# Patient Record
Sex: Female | Born: 1975
Health system: Southern US, Community
[De-identification: ages and names within clinical notes are randomized; demographics above are authoritative.]

## PROBLEM LIST (undated history)

## (undated) DIAGNOSIS — E0590 Thyrotoxicosis, unspecified without thyrotoxic crisis or storm: Secondary | ICD-10-CM

## (undated) DIAGNOSIS — E559 Vitamin D deficiency, unspecified: Secondary | ICD-10-CM

## (undated) DIAGNOSIS — R87619 Unspecified abnormal cytological findings in specimens from cervix uteri: Secondary | ICD-10-CM

## (undated) DIAGNOSIS — E079 Disorder of thyroid, unspecified: Secondary | ICD-10-CM

## (undated) DIAGNOSIS — O24419 Gestational diabetes mellitus in pregnancy, unspecified control: Secondary | ICD-10-CM

## (undated) DIAGNOSIS — R519 Headache, unspecified: Secondary | ICD-10-CM

## (undated) DIAGNOSIS — R51 Headache: Secondary | ICD-10-CM

## (undated) DIAGNOSIS — Z5189 Encounter for other specified aftercare: Secondary | ICD-10-CM

## (undated) DIAGNOSIS — O139 Gestational [pregnancy-induced] hypertension without significant proteinuria, unspecified trimester: Secondary | ICD-10-CM

## (undated) DIAGNOSIS — D649 Anemia, unspecified: Secondary | ICD-10-CM

## (undated) HISTORY — DX: Gestational diabetes mellitus in pregnancy, unspecified control: O24.419

## (undated) HISTORY — DX: Anemia, unspecified: D64.9

## (undated) HISTORY — DX: Encounter for other specified aftercare: Z51.89

## (undated) HISTORY — DX: Unspecified abnormal cytological findings in specimens from cervix uteri: R87.619

## (undated) HISTORY — DX: Gestational (pregnancy-induced) hypertension without significant proteinuria, unspecified trimester: O13.9

## (undated) HISTORY — DX: Vitamin D deficiency, unspecified: E55.9

## (undated) HISTORY — DX: Disorder of thyroid, unspecified: E07.9

---

## 1993-01-12 HISTORY — PX: WISDOM TOOTH EXTRACTION: SHX21

## 1997-06-04 ENCOUNTER — Other Ambulatory Visit: Admission: RE | Admit: 1997-06-04 | Discharge: 1997-06-04 | Payer: Self-pay | Admitting: Obstetrics and Gynecology

## 1998-09-12 ENCOUNTER — Other Ambulatory Visit: Admission: RE | Admit: 1998-09-12 | Discharge: 1998-09-12 | Payer: Self-pay | Admitting: Obstetrics and Gynecology

## 1998-12-20 ENCOUNTER — Other Ambulatory Visit: Admission: RE | Admit: 1998-12-20 | Discharge: 1998-12-20 | Payer: Self-pay | Admitting: Obstetrics and Gynecology

## 1999-01-13 DIAGNOSIS — R87619 Unspecified abnormal cytological findings in specimens from cervix uteri: Secondary | ICD-10-CM

## 1999-01-13 HISTORY — DX: Unspecified abnormal cytological findings in specimens from cervix uteri: R87.619

## 1999-01-13 HISTORY — PX: COLPOSCOPY: SHX161

## 1999-02-19 ENCOUNTER — Other Ambulatory Visit: Admission: RE | Admit: 1999-02-19 | Discharge: 1999-02-19 | Payer: Self-pay | Admitting: Obstetrics and Gynecology

## 1999-02-19 ENCOUNTER — Encounter (INDEPENDENT_AMBULATORY_CARE_PROVIDER_SITE_OTHER): Payer: Self-pay

## 2000-01-12 ENCOUNTER — Encounter: Payer: Self-pay | Admitting: Obstetrics and Gynecology

## 2000-01-12 ENCOUNTER — Ambulatory Visit (HOSPITAL_COMMUNITY): Admission: RE | Admit: 2000-01-12 | Discharge: 2000-01-12 | Payer: Self-pay | Admitting: Obstetrics and Gynecology

## 2000-01-18 ENCOUNTER — Observation Stay (HOSPITAL_COMMUNITY): Admission: AD | Admit: 2000-01-18 | Discharge: 2000-01-19 | Payer: Self-pay | Admitting: Obstetrics and Gynecology

## 2000-01-30 ENCOUNTER — Inpatient Hospital Stay (HOSPITAL_COMMUNITY): Admission: AD | Admit: 2000-01-30 | Discharge: 2000-02-03 | Payer: Self-pay | Admitting: Obstetrics and Gynecology

## 2000-01-31 DIAGNOSIS — Z5189 Encounter for other specified aftercare: Secondary | ICD-10-CM

## 2000-01-31 HISTORY — DX: Encounter for other specified aftercare: Z51.89

## 2000-02-01 ENCOUNTER — Encounter: Payer: Self-pay | Admitting: Obstetrics and Gynecology

## 2000-02-20 ENCOUNTER — Encounter: Admission: RE | Admit: 2000-02-20 | Discharge: 2000-03-21 | Payer: Self-pay | Admitting: Obstetrics and Gynecology

## 2000-03-16 ENCOUNTER — Other Ambulatory Visit: Admission: RE | Admit: 2000-03-16 | Discharge: 2000-03-16 | Payer: Self-pay | Admitting: Obstetrics and Gynecology

## 2001-04-19 ENCOUNTER — Other Ambulatory Visit: Admission: RE | Admit: 2001-04-19 | Discharge: 2001-04-19 | Payer: Self-pay | Admitting: Obstetrics and Gynecology

## 2002-07-14 ENCOUNTER — Other Ambulatory Visit: Admission: RE | Admit: 2002-07-14 | Discharge: 2002-07-14 | Payer: Self-pay | Admitting: Obstetrics and Gynecology

## 2003-10-05 ENCOUNTER — Encounter: Admission: RE | Admit: 2003-10-05 | Discharge: 2003-10-05 | Payer: Self-pay | Admitting: Obstetrics and Gynecology

## 2004-11-17 ENCOUNTER — Inpatient Hospital Stay (HOSPITAL_COMMUNITY): Admission: AD | Admit: 2004-11-17 | Discharge: 2004-11-17 | Payer: Self-pay | Admitting: Obstetrics and Gynecology

## 2005-03-16 ENCOUNTER — Encounter: Admission: RE | Admit: 2005-03-16 | Discharge: 2005-03-16 | Payer: Self-pay | Admitting: Obstetrics and Gynecology

## 2005-04-13 ENCOUNTER — Ambulatory Visit (HOSPITAL_COMMUNITY): Admission: RE | Admit: 2005-04-13 | Discharge: 2005-04-13 | Payer: Self-pay | Admitting: Obstetrics and Gynecology

## 2005-04-14 ENCOUNTER — Inpatient Hospital Stay (HOSPITAL_COMMUNITY): Admission: AD | Admit: 2005-04-14 | Discharge: 2005-06-07 | Payer: Self-pay | Admitting: Obstetrics and Gynecology

## 2005-06-04 ENCOUNTER — Encounter (INDEPENDENT_AMBULATORY_CARE_PROVIDER_SITE_OTHER): Payer: Self-pay | Admitting: *Deleted

## 2007-06-18 IMAGING — US US FETAL BPP W/O NONSTRESS
1 series · 14 of 26 positions shown · non-contrast
Comparison: OB ultrasound 04/16/05.

CLINICAL DATA: 30 weeks pregnant.  Vaginal bleeding. 

 BIOPHYSICAL PROFILE

[Series 1: us fetal bpp w/o nonstress · 0.33mm/px · 26 acquisitions, 14 frames shown]
[im 1/26]
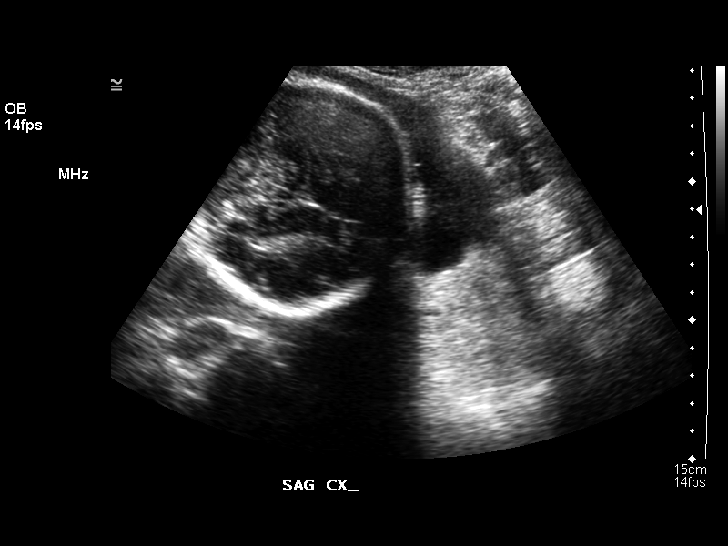
[im 3/26]
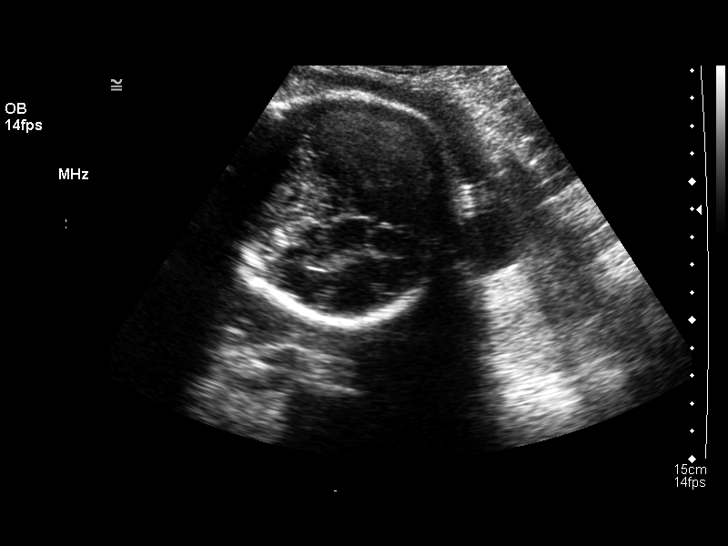
[im 5/26]
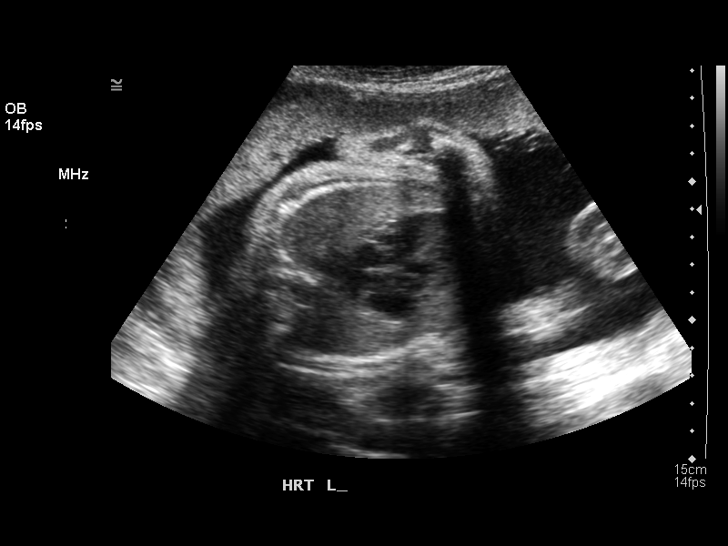
[im 7/26]
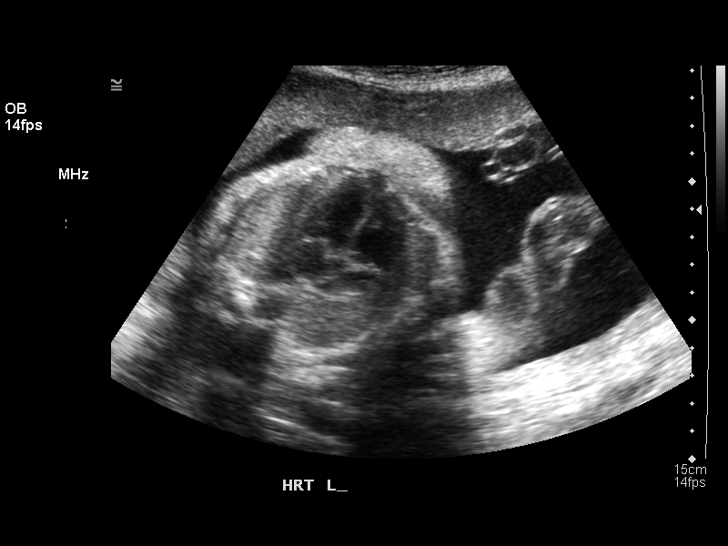
[im 9/26]
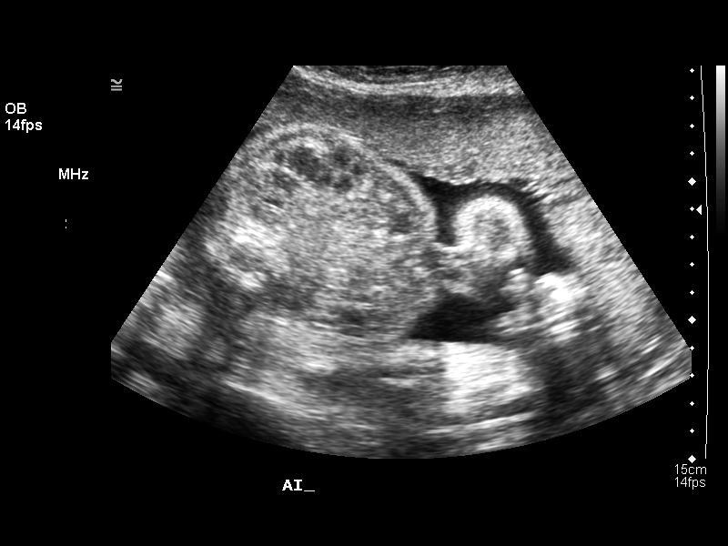
[im 11/26]
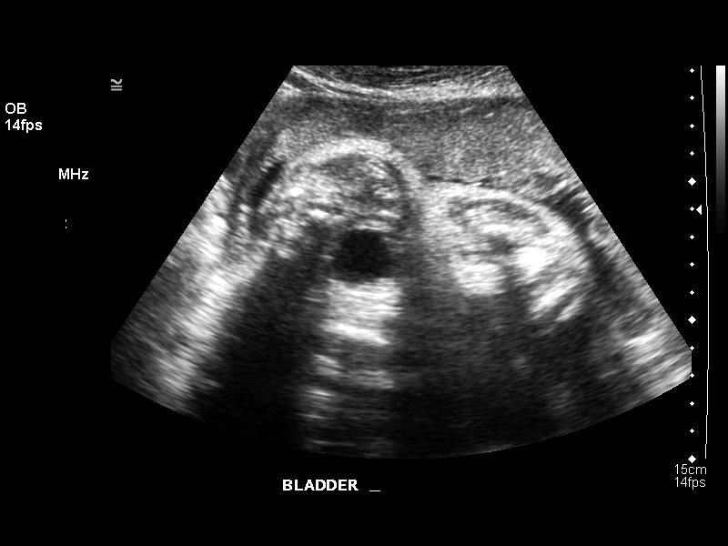
[im 13/26]
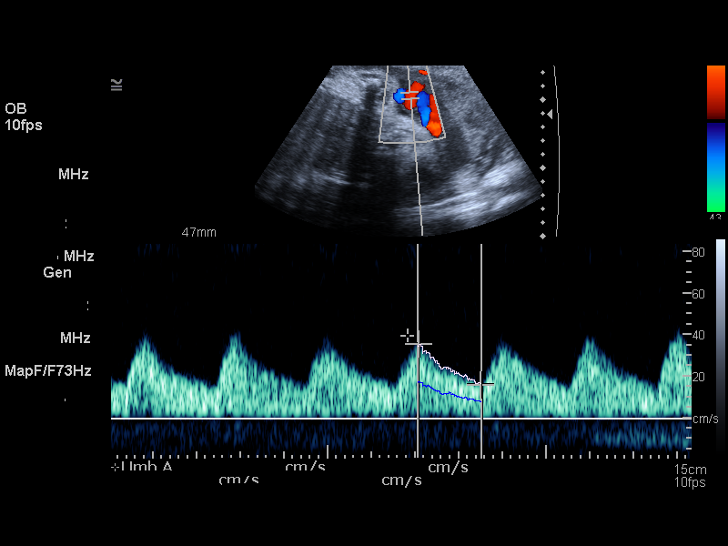
[im 14/26]
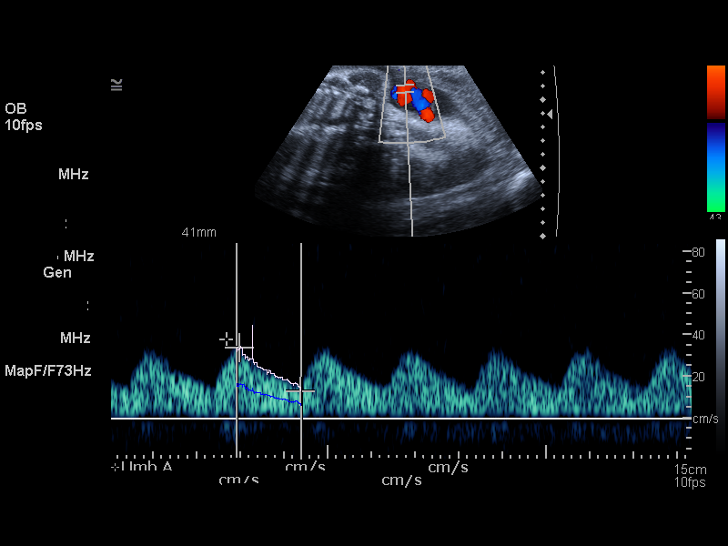
[im 16/26]
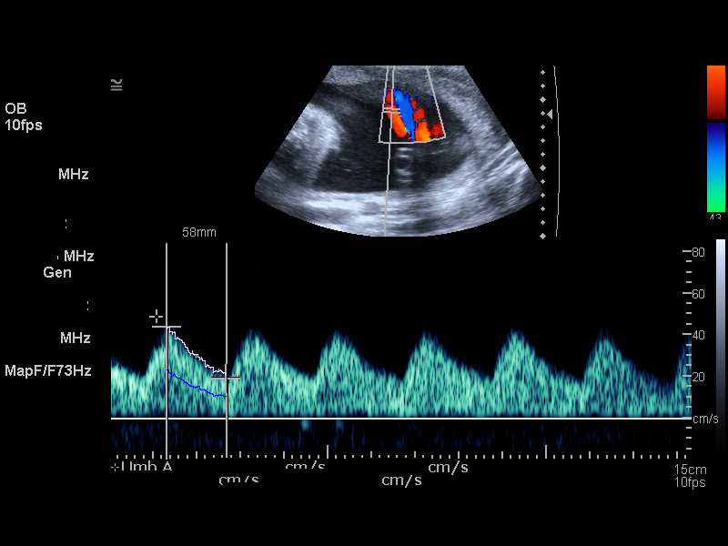
[im 18/26]
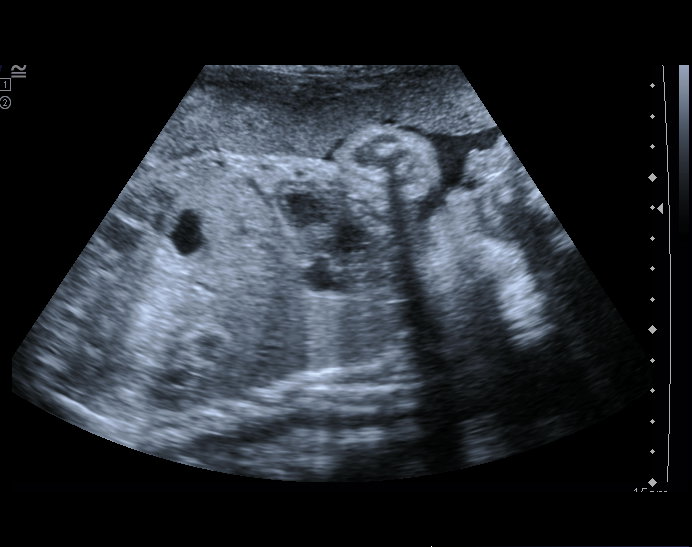
[im 20/26]
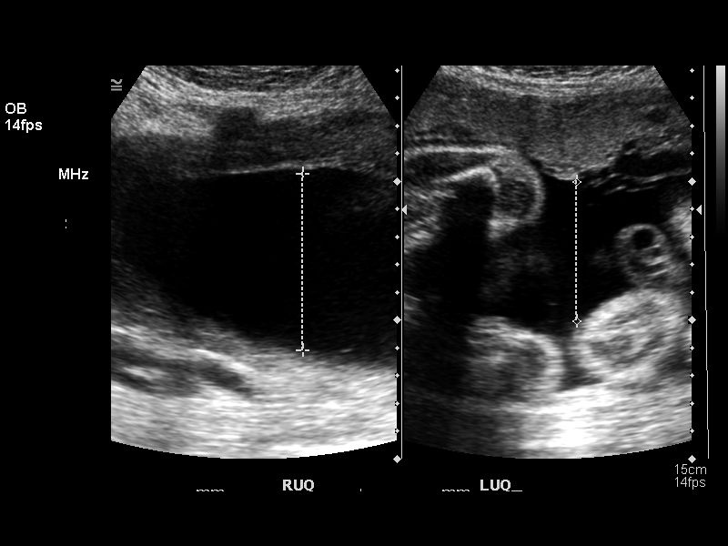
[im 22/26]
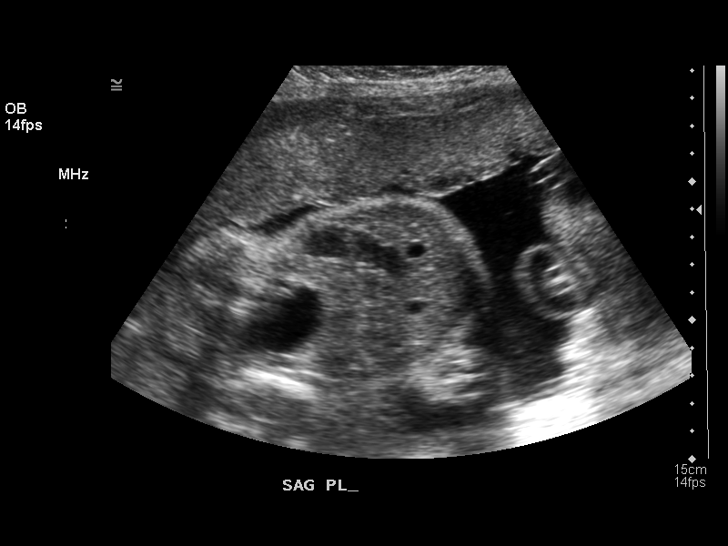
[im 24/26]
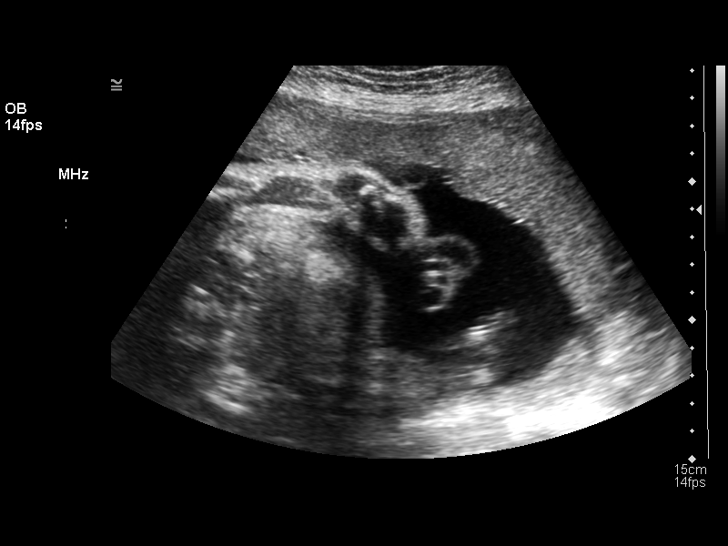
[im 26/26]
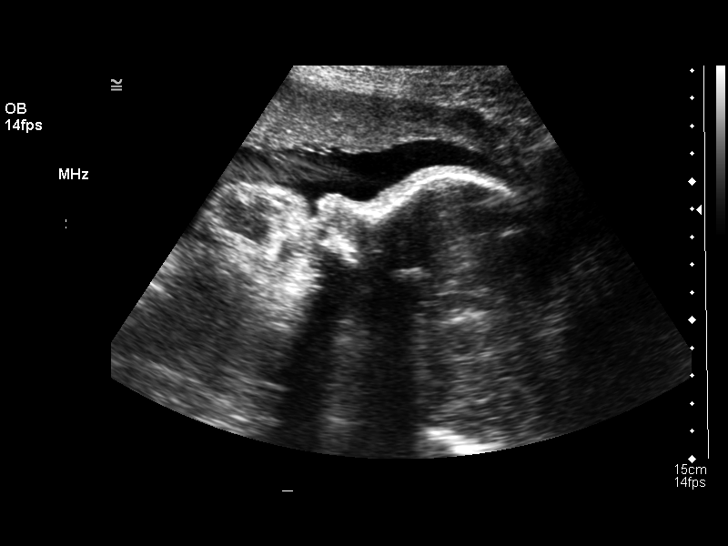

[14 of 26 positions shown; findings below may reference images not displayed]

Number of Fetuses:  1
 Heart rate:  160
 Movement:  Yes
 Breathing:  Yes
 Presentation:  Cephalic
 Placental Location:  Anterior
 Grade:  I
 Previa:  No
 Amniotic Fluid (Subjective):  Upper normal
 Amniotic Fluid (Objective): 21.0 cm AFI (5th -95th%ile = 8.8 ? 23.8 cm for 31 wks)

 Fetal measurements and complete anatomic evaluation were not requested.  The following fetal anatomy was visualized on this exam:  Lateral ventricles, four chamber heart, stomach, kidneys, and bladder.

 BPP SCORING
 Movements:  2  Time:  20 minutes
 Breathing:  2
 Tone: 2
 Amniotic Fluid:  2
 Total Score:  8

 MATERNAL UTERINE AND ADNEXAL FINDINGS
 Cervix:  Not evaluated.  

 DOPPLER ULTRASOUND OF FETUS:

 Umbilical Artery S/D Ratio:  2.44  (NL< 4.00)
IMPRESSION: 1.  Single live intrauterine gestation in cephalic presentation.  Biophysical profile [DATE] over 20 minutes.
 2.  Normal umbilical arterial Doppler S/D ratio and waveform morphology.

## 2007-10-11 ENCOUNTER — Ambulatory Visit (HOSPITAL_COMMUNITY): Admission: RE | Admit: 2007-10-11 | Discharge: 2007-10-12 | Payer: Self-pay | Admitting: Obstetrics and Gynecology

## 2007-10-11 ENCOUNTER — Encounter (INDEPENDENT_AMBULATORY_CARE_PROVIDER_SITE_OTHER): Payer: Self-pay | Admitting: Obstetrics and Gynecology

## 2007-10-11 HISTORY — PX: ABDOMINAL HYSTERECTOMY: SHX81

## 2008-05-12 LAB — HM PAP SMEAR

## 2008-07-26 ENCOUNTER — Ambulatory Visit: Payer: Self-pay | Admitting: Gastroenterology

## 2008-08-10 ENCOUNTER — Ambulatory Visit: Payer: Self-pay | Admitting: Gastroenterology

## 2008-09-07 ENCOUNTER — Ambulatory Visit: Payer: Self-pay | Admitting: Gastroenterology

## 2010-02-02 ENCOUNTER — Encounter: Payer: Self-pay | Admitting: Obstetrics and Gynecology

## 2010-05-27 NOTE — Op Note (Signed)
Alicia Alexander, Alicia Alexander          ACCOUNT NO.:  [id number]   MEDICAL RECORD NO.:  [id number]          PATIENT TYPE:  OIB   LOCATION:  9317                          FACILITY:  [facility]   PHYSICIAN:  Maxie Better, M.D.DATE OF BIRTH:  1975-07-24   DATE OF PROCEDURE:  10/11/2007  DATE OF DISCHARGE:                               OPERATIVE REPORT   PREOPERATIVE DIAGNOSES:  Menorrhagia, dysmenorrhea.   PROCEDURE:  Laparoscopic total hysterectomy.   POSTOPERATIVE DIAGNOSES:  Menorrhagia, dysmenorrhea.   ANESTHESIA:  General.   SURGEON:  Maxie Better, MD   ASSISTANT:  Lenoard Aden, MD   PROCEDURE:  Under adequate general anesthesia, the patient was placed in  the dorsal lithotomy position.  She was sterilely prepped and draped in  usual fashion.  An indwelling Foley catheter was sterilely placed.  Examination under anesthesia had revealed the uterus was slightly  enlarged.  No adnexal masses could be appreciated.  A weighted speculum  was placed in vagina.  Sims retractor used anteriorly.  The cervix was  grasped with a single-tooth tenaculum, it sounded to 8 cm.  Cervix was  then serially dilated and then a RUMI applicator was then inserted  without incident.  The weighted speculum was removed.  Attention was  then turned to the abdomen.  An infraumbilical incision was made after  0.25% percent Marcaine was injected, carried down to the rectus fascia.  The rectus fascia was opened transversely.  The parietal peritoneum was  opened bluntly.  The pursestring suture of 0 Vicryl was then placed and  a Hassan cannula was introduced into the abdomen without incident.  A  lighted video laparoscope was then inserted.  The abdomen was  insufflated.  Panoramic view was then done.  Normal liver edge noted.  Attention turned to the pelvis, 2 small incisions, 5 mm ports were  placed on the right and left side and 5 mm ports were placed under  direct visualization.  Inspection of the  pelvis was notable for normal  tubes and ovaries.  The left tube had some small paratubal cysts.  Some  bladder adhesions from her previous cesarean section, but otherwise  unremarkable pelvis and evidence of the bicornuate uterus which was  already known.  The procedure was started with the round ligaments being  on the left cauterized and cut.  The utero-ovarian ligament on the left  was then cauterized and cut until the level of the bladder reflection  was reached anteriorly.  The anterior peritoneum was opened.  There was  some scarring from the previous surgery, and therefore careful  dissection was then performed with the RUMI apparatus clearly being  noted to be bulging.  The uterine vessel was skeletonized on the left,  cauterized and cut.  The same procedure was performed on the  contralateral side.  Ureters were seen peristalsing deep in the pelvis.  Continued dissection of the bladder flap was then performed.  Once this  was done, the uterine vessels was also cauterized and cut on the right  as well.  After it was felt to be adequate, the anterior vagina was  opened above the  bladder flap and the circumferential incision was then  made with the posterior opening being done above the uterosacral  ligaments.  The uterus was ultimately detached from its vaginal  attachment, pulled into the vagina.  The cuff which had some bleeding  was then cauterized.  It was closed with a Quill suture.  Taking care  not to bring the bladder in, the integrity of the vaginal cuff was  examined explored with digital finger and was intact.  Small bleeder was  noted on the right side where the bladder reflected over and careful  cauterization was then performed with good hemostasis subsequently  noted.  When that was done, the was specimens removed from the vagina  and was copiously irrigated, suctioned and the bleeders cauterized.  Good hemostasis was noted.  At that point, the procedure was felt  to  have been complete.  A fourth incision site had been placed to the left  of the umbilicus in order to facilitate the closure of the vaginal cuff.  All instruments were then removed and the pursestring was closed  infraumbilically.  The skin incision was closed with 4-0 Vicryl  subcuticular stitch and/or Dermabond were appropriate.  Specimen of the  uterus and cervix sent to pathology.  Intraoperative fluid was 2 liters.  Urine output was 40 mL, clear yellow urine.  Estimated blood loss was  about 100 mL.  Sponge and instrument counts x2 was correct.  Complication was none.  The patient tolerated the procedure well and was  transferred to recovery in stable condition.      Maxie Better, M.D.  Electronically Signed     [place]/MEDQ  D:  10/11/2007  T:  10/12/2007  Job:  [id number]

## 2010-05-30 NOTE — H&P (Signed)
[facility]  Patient:    Alicia Alexander, Alicia Alexander                   MRN: [id number] Adm. Date:  01/30/00 Attending:  Nena Jordan A. Cherly Hensen, M.D.                         History and Physical  CHIEF COMPLAINT:              Oligohydramnios, elevated blood pressure, and breech presentation.  HISTORY OF PRESENT ILLNESS:   This is a 35 year old gravida 2 para 0-0-1-0 female at 74 and five-sevenths weeks gestation by [facility] of February 09, 2000 who is now being admitted for a primary cesarean section secondary to oligohydramnios and breech presentation.  The patient also pregnancy-induced hypertension.  She presented to the office for her preoperative visit prior to her scheduled cesarean section for February 02, 2000 at which time her fundal height was noted to be 34-35 cm and her blood pressure was 140/94.  PIH labs were obtained and ultrasound was ordered to check amniotic fluid index and fetal position.  The ultrasound revealed breech oblique lie, oligohydramnios with the amniotic fluid index in less than the third percentile, and the estimated fetal weight of 8 pounds 4 ounces.  The patient had an NST that was reactive on January 30, 2000.  The [facility] labs revealed a platelet count of 242,000.  Her hematocrit was 37.8, her uric acid was 5.7.  Her AST was 18, her ALT was 13.  The patient had had a 24-hour urine collection on January 22, 2000 that showed 180 mg/24 hours of protein and a creatinine clearance of 167 ml/minute.  The patient has been having antenatal surveillance with nonstress tests twice a week due to the pregnancy-induced hypertension.  The patient has not had any headaches, visual changes, or epigastric pain.  The patient was noted to have a beginning of elevation of blood pressures since January 08, 2000.  She was placed on bedrest with good response in the form of decreasing her peripheral edema.  Given her oligohydramnios and breech  presentation, decision was made to proceed with a primary cesarean section today.  Risks and benefits of the procedure have been explained to the patient and her husband.  LABORATORY DATA:              [facility] prenatal care, Maxie Better, M.D.  The blood type is O positive, antibody screen is negative.  RPR is nonreactive.  Rubella is immune.  Hepatitis B surface antigen is negative. HIV test was negative.  GC and chlamydia test was negative.  Pap was normal. Her AFP3 test was normal.  Anatomic fetal survey on September 30, 1999 revealed a normal survey at 21.4 weeks.  One-hour GCT was normal.  Group B strep culture was negative.  Several PIH studies have been done - the latest one is dictated in the history of present illness.  PAST MEDICAL HISTORY:  ALLERGIES:                    DARVOCET - nausea and vomiting.  MEDICATIONS:                  Prenatal vitamins.  MEDICAL HISTORY:              Irritable bowel syndrome.  SURGICAL HISTORY:             Wisdom tooth extraction.  Removal of a dysplastic compound nevus in 2001.  OBSTETRICAL HISTORY:          First trimester spontaneous abortion in September 1994, no complication.  GYNECOLOGICAL HISTORY:        History of abnormal Pap smear in August 2000. GCA treatment on the cervix has been performed due to the koilocytic atypia that had been noted on the cervical biopsy at the time of the colposcopy.  FAMILY HISTORY:               Maternal grandmother - hypertension.  Maternal grandmother - also hemophilia.  Maternal grandmother with some type of thyroid dysfunction.  SOCIAL HISTORY:               Married, nonsmoker, CMA.  REVIEW OF SYSTEMS:            Otherwise negative.  PHYSICAL EXAMINATION:  GENERAL:                      Well-developed, well-nourished gravid female in no acute distress.  VITAL SIGNS:                  Blood pressure 140/94; 150/100 at the hospital. Weight is 181 pounds.  SKIN:                          Shows no lesions.  There are some skin tags.  HEENT:                        Anicteric sclerae, pink conjunctivae. Oropharynx negative.  HEART:                        Regular rate and rhythm without murmur.  LUNGS:                        Clear to auscultation.  ABDOMEN:                      Gravid, fundal height of 34-35 cm.  PELVIC:                       Fingertip external os, internal os could not be reached, 2 cm long, presenting part out of pelvic.  EXTREMITIES:                   No edema.  Deep tendon reflexes 2+.  IMPRESSION:                   1. Oligohydramnios, breech presentation, term                                  gestation.                               2. Pregnancy-induced hypertension without the                                  need for magnesium sulfate.  PLAN:                         Admission, primary cesarean section.  Risks and benefits  of the procedure have been explained to the patient, including but not limited to infection, bleeding, injury to surrounding organ structures, possible need for cesarean section in the future, internal scar tissue.  All questions are answered. DD:  01/30/00 TD:  01/31/00 Job: [id number]

## 2010-05-30 NOTE — H&P (Signed)
NAMELONDYN, HOTARD          ACCOUNT NO.:  [id number]   MEDICAL RECORD NO.:  [id number]          PATIENT TYPE:  MAT   LOCATION:  MATC                          FACILITY:  [facility]   PHYSICIAN:  Richardean Sale, M.D.   DATE OF BIRTH:  Nov 30, 1975   DATE OF ADMISSION:  04/14/2005  DATE OF DISCHARGE:                                HISTORY & PHYSICAL   ADMITTING DIAGNOSIS:  A 30 plus week intrauterine pregnancy with vaginal  bleeding.   HISTORY OF PRESENT ILLNESS:  This is a 35 year old, gravida 3, para 1-0-1-1,  white female who presented to the office this morning complaining of  irregular contractions and vaginal bleeding at approximately 7:30 a.m.  The  patient states the bleeding has been brown to light red in color and  approximately a tablespoon total amount.  The patient was evaluated in the  office.  Cervix was closed and long with no evidence of rupture of membranes  and a small amount of vaginal bleeding noted.  The patient reports good  fetal movement.  The patient was subsequently sent to maternity admissions  for additional evaluation.   Prenatal care has been at [facility] with Dr. Maxie Better as the  primary attending.   Pregnancy is complicated by:  1.  Chronic hypertension controlled with labetalol 100 mg p.o. b.i.d.  2.  Gestational diabetes which is diet-controlled.  3.  Polyhydramnios.  4.  Bicornuate uterus.  5.  Hyperemesis in the first trimester with transient hyperthyroidism, not      requiring medication.  6.  Fetal renal pyelectastis which had resolved on ultrasound at 23 weeks.   PAST OBSTETRIC HISTORY:  1.  Gravida 1:  A 38-week delivery by cesarean section, complicated by      oligohydramnios, pre-eclampsia, and post partum hemorrhage requiring      transfusion.  2.  Gravida 2:  SAB.  3.  Gravida 3:  Current.   GYNECOLOGIC HISTORY:  1.  Bicornuate uterus.  2.  History of HPV.  No cervical conization, LEEP, or cryotherapy.  No history  of gonorrhea,  Chlamydia or herpes.   PAST SURGICAL HISTORY:  C-section x1.   PAST MEDICAL HISTORY:  1.  Chronic hypertension.  2.  Irritable bowel syndrome.   SOCIAL HISTORY:  She denies tobacco, alcohol, or drugs.   FAMILY HISTORY:  Positive for heart disease, hypertension, thyroid  dysfunction, rheumatoid arthritis in the maternal grandmother.  Tuberculosis  in the great grandmother.  Diabetes in her father.  Maternal aunt has lupus  anticoagulant/questionable antiphospholipid antibody syndrome.  Down  syndrome in a distant relative of the father of the baby and the patient's  mother had a first cousin born with a three chamber heart.   CANCER HISTORY:  Positive for melanoma in a paternal great grandmother and  pancreatic cancer in a paternal grandfather and maternal great grandmother.  No history of breast, ovarian, uterine, or colon cancer.   MEDICATIONS:  1.  Labetalol 100 mg p.o. b.i.d.  2.  Prenatal vitamins one p.o. every day.  3.  Previously on a Zofran pump in first trimester.   ALLERGIES:  No  known drug allergies.   REVIEW OF SYSTEMS:  Positive fetal movement.  Positive irregular  contractions.  Positive scant vaginal bleeding.  No loss of fluid.  The  patient denies chest pain, shortness of breath, headache, visual changes,  epigastric pain, nausea or vomiting.  Denies fevers, chills, or sweats.   PHYSICAL EXAMINATION:  VITAL SIGNS:  Blood pressure is 113/69, pulse 97,  respirations 20, temperature 97.6.  GENERAL:  She is a well-developed, well-nourished, white female who is in no  acute distress.  HEART:  Regular rate and rhythm.  LUNGS:  Clear to auscultation bilaterally.  ABDOMEN:  Gravid, soft, nontender.  Contractions palpate mild.  Appears AGA.  EXTREMITIES:  No cyanosis, clubbing, or edema.  Deep tendon reflexes are 1+  bilaterally with no clonus.  NEUROLOGIC:  Nonfocal.  PELVIC:  The cervix is closed, thick, and long with scant dark red blood  present  on exam glove.  No blood at introitus.   Fetal heart rate tracing is in the 130s/140s and reassuring.  Occasional  mild variable deceleration noted.  Contractions:  Irregular up to every four  to six minutes.   PRENATAL LABORATORY:  Blood type is O positive.  Antibody screen negative.  RPR nonreactive.  Rubella immune.  Hepatitis B surface antigen nonreactive.  GC Chlamydia screen negative.  First trimester urine culture negative.  Baseline pre-eclampsia labs within normal limits.  One-hour glucose  challenge 145.  Three-hour glucose challenge, fasting blood sugar 103, one-  hour 201, two-hour 150, three-hour 92.   HOSPITAL LABORATORY STUDIES:  Fibrinogen 487.  Platelets 262.  INR 0.9, PTT  26, PT 12.5.  These are all within normal limits.  CBC:  White count 9.7,  hemoglobin 12.4, hematocrit 36.2, platelets 262.   ASSESSMENT:  70.  A 35 year old gravida 3, para 1-0-1-1 white female at 30+ weeks with      third trimester bleeding, suspect preterm labor versus possible marginal      placental separation.  No previa seen on ultrasound.  Plan:  Admit to      antenatal.  Continue with fetal monitoring. We will administer      betamethasone 12.5 mg IM x1 now and repeat in 24 hours.  Start Procardia      10 mg p.o. q.6h.  We will obtain Group B beta strep culture and start      Ampicillin per protocol until results available.  2.  Hypertension.  The patient's blood pressure is normal today.  She did      not take her labetalol this morning.  We will monitor blood pressures      and see if there is any further hypertension with Procardia, and we will      administer labetalol as needed.  3.  History of prior cesarean delivery.  Fetal heart rate tracing is      reassuring.  No evidence of uterine rupture.  Recommend repeat cesarean      section for delivery.  4.  We will obtain a neonatal intensive care unit consultation on admission. 5.  Ultrasound performed within the last 2 weeks revealed  slightly increased      amniotic fluid and an appropriate for gestational age fetus.      Richardean Sale, M.D.  Electronically Signed     [place]/MEDQ  D:  04/14/2005  T:  04/14/2005  Job:  [id number]

## 2010-05-30 NOTE — Op Note (Signed)
Alicia Alexander, Alicia Alexander          ACCOUNT NO.:  [id number]   MEDICAL RECORD NO.:  [id number]          PATIENT TYPE:  INP   LOCATION:  9129                          FACILITY:  [facility]   PHYSICIAN:  Maxie Better, M.D.DATE OF BIRTH:  [birth year]   DATE OF PROCEDURE:  06/04/2005  DATE OF DISCHARGE:                                 OPERATIVE REPORT   PREOPERATIVE DIAGNOSIS:  Previous cesarean section, class A2 gestational  diabetes, chronic hypertension, bicornuate uterus, marginal placental  abruption.   PROCEDURE:  A repeat cesarean section Kerr hysterotomy.   POSTOPERATIVE DIAGNOSIS:  Previous cesarean section, class A2 gestational  diabetes, chronic hypertension, bicornuate uterus, marginal placental  abruption.   ANESTHESIA:  Spinal.   SURGEON:  Maxie Better, M.D.   ASSISTANT:  Richardean Sale, M.D.   PROCEDURE:  Under adequate spinal anesthesia, the patient is placed in the  supine position with left lateral tilt.  She was sterilely prepped and  draped in usual fashion.  Quarter percent Marcaine was injected along the  previous Pfannenstiel skin incision.  Pfannenstiel skin incision was then  made, carried down to the rectus fascia.  Rectus fascia opened transversely.  The rectus fascia was then bluntly and sharply dissected off the rectus  muscles superiorly and inferiorly.  On separating the rectus fascia from the  rectus muscle, the parietal peritoneum was incidentally opened without any  evidence of injury to any underlying structure.  The parietal peritoneum was  then opened superiorly and inferiorly.  The vesicouterine peritoneum was  noted to have some bladder adhesions.  Prominent vessels were noted,  particularly on the right aspect of the lower uterine segment and the  patient was felt to have a anterior placenta.  The vesicouterine peritoneum  was then carefully opened, the bladder bluntly dissected off the lower  uterine segment displaced inferiorly.   Incision was made in lower uterine  segment and extended bluntly.  Artificial rupture of membranes and copious  fluid admixed with blood was then noted.  The baby was delivered from vertex  presentation.  Bulb suctioned on the abdomen.  Cord was clamped, cut, the  baby was transferred to the awaiting pediatricians who assigned a Apgars of  7and 9. The placenta which was large was manually removed.  Uterine cavity  was cleaned of debris.  The uterus was then exteriorized.  The evidence of  bicornuate uterus was noted. Normal tubes and ovaries were noted  bilaterally.  A 2.5 cm pedunculated fundal fibroid was noted.  Uterine  incision had no extension.  It closed with 0 Monocryl running locked stitch  first layer partially imbricated with 0 Monocryl in a second layer for  hemostasis.  The uterus was then returned to the abdomen.  Copious  irrigation of the abdomen was then performed.  The paracolic gutters were  cleaned of debris.  Uterine incision was inspected and bleeding on the left  angle resulted in 0 Monocryl running locked stitch being started and with  hemostasis subsequently achieved.  The parietal peritoneum was not closed.  The rectus fascia was closed with 0 Vicryl x2.  The subcutaneous areas  irrigated.  2-0 plain interrupted sutures were placed in that area.  The  skin was approximated using Ethicon staples.  Specimen was placenta sent to  pathology.  Estimated blood loss was 1 liter.  Urine output was 150 mL clear yellow urine.  Intraoperative fluid was 2600  mL crystalloid.  Weight of the baby was 7 pounds 6 ounces. Sponge and  instrument counts x2 was correct.  Complication was none.  The patient  tolerated the procedure well, was transferred to recovery in stable  condition.      Maxie Better, M.D.  Electronically Signed     [place]/MEDQ  D:  06/04/2005  T:  06/05/2005  Job:  [id number]

## 2010-05-30 NOTE — H&P (Signed)
[facility]  Patient:    Alicia Alexander, Alicia Alexander                   MRN: [id number] Adm. Date:  [id number] Disc. Date: [id number] Attending:  Lenoard Aden CC:         [facility]   History and Physical  Twenty-four hour observation.  CHIEF COMPLAINT:              Headache.  HISTORY OF PRESENT ILLNESS:   The patient is a 35 year old white female, G2, P0, EDD 02/09/99, at 37-1/7 weeks, who has a history of elevated blood pressure and now headache unresolved with Tylenol use over the last 48 hours.  Elevated blood pressure is seen here in maternity admissions.  The patient is admitted for serial lab monitoring and serial blood pressure monitoring.  PAST MEDICAL HISTORY:         The patient has ALLERGIES to DARVOCET.  MEDICATIONS:                  Prenatal vitamins.  PAST OBSTETRIC HISTORY:       One uncomplicated pregnancy loss in 1994.  GYNECOLOGIC HISTORY:          Abnormal Pap in August 2000, with colposcopy in February of 2001 and TCA treatment of the cervix.  The patient has a history of irritable bowel syndrome and wisdom tooth removal.  FAMILY HISTORY:               Family history of melanoma and pancreatic cancer, thyroid problems, coagulopathy, and hypertension.  PRENATAL LABORATORY DATA:     Blood type O positive.  Rh antibody negative. VDRL nonreactive.  Rubella immune.  Hepatitis B surface antigen and human HIV negative.  Toxoplasmosis titer is negative.  GC, Chlamydia negative.  Group B strep is pending.  PHYSICAL EXAMINATION:  GENERAL:                      The patient is a well-developed, well-nourished, white female in no apparent distress.  VITAL SIGNS:                  Admission blood pressures include an initial bp. of 139/95, then 139/96, 139/99, and 148/101.  ADMISSION LABORATORY VALUES:  Normal CBC, normal liver function tests, and 1+ protein in the urine.  HEENT:                        Normal.  LUNGS:                         Clear.  HEART:                        Regular rhythm.  ABDOMEN:                      Soft, gravid, nontender.  Breech by [place].  PELVIC:                       Cervical exam is deferred.  EXTREMITIES:                  DTRs are 2+.  No evidence of clonus.  No CVA tenderness is noted.  NEUROLOGIC:                   Exam is nonfocal.  IMPRESSION:                   1. Term intrauterine pregnancy at 37+ weeks.                               2. Breech malpresentation.                               3. New onset pregnancy-induced hypertension with                                  headache today.  PLAN:                         Admit.  Check serial blood pressures, serial labs.  Medicate headache with Stadol and Phenergan.  Watch closely for exacerbation of pregnancy-induced hypertension versus preeclampsia.  Follow up in the morning.  Make a decision regarding delivery versus discharge at that time. DD:  01/19/00 TD:  01/19/00 Job: [id number]

## 2010-05-30 NOTE — Discharge Summary (Signed)
[facility]  Patient:    Alicia Alexander, Alicia Alexander                 MRN: [id number] Adm. Date:  [id number] Disc. Date: [id number] Attending:  Maxie Better                           Discharge Summary  ADMISSION DIAGNOSES:          Breech presentation, term gestation, pregnancy induced hypertension, oligohydramnios.  DISCHARGE DIAGNOSES:          Term gestation delivered, pregnancy induced hypertension, wound seroma, postoperative anemia.  PROCEDURE:                    Primary cesarean section, blood transfusion.  HISTORY OF PRESENT ILLNESS:   This is a 35 year old gravida 2, para 0-0-1-0 female at 38+ weeks gestation admitted for primary cesarean section secondary to oligohydramnios, breech presentation.  The patient has also had pregnancy induced hypertension which was followed in the office and treated with bed rest.  PRENATAL LABORATORIES:        Blood type is O+.  Antibody screen is negative. Rubella is immune.  Hepatitis B surface antigen is negative.  Group B strep culture was negative.  Please see the dictated history and physical for additional details.  HOSPITAL COURSE:              The patient was admitted on January 30, 2000. She was taken to the operating room where she underwent a primary cesarean section via spinal anesthesia.  Subsequent delivery in an [place] style position of a live female infant with Apgars of 8 and 9, weight of 7 pounds 12 ounces.  Normal tubes and ovaries are noted to be present.  Estimated blood loss was 800 cc.  Complications:  None.  Postoperatively her course was complicated by a syncopal episode for which the patient had a CBC done that showed a hemoglobin of 7.3.  The thought was her syncopal episode was secondary to the anemia.  She was given 2 units of packed red cells and started on iron supplementation subsequently.  The CBC after the transfusion increased the hemoglobin to 8.5.  Her preoperative  hematocrit was 35.0.  The patients blood pressure did not warrant any addition of magnesium sulfate. Her platelet count was normal throughout.  On postoperative day #2 the incision was noted to have an area of light coloring extending one inch above the incision and laterally.  It was probed and 20-25 cc of serosanguineous fluid was removed.  The patient had a small amount of ______.  The patient, given the hematocrit, had an ultrasound to rule out ongoing intra-abdominal bleeding due to the fact that her hemoglobin had not significantly risen after 2 units.  The ultrasound showed an inhomogeneous thickened endometrium.  The right ovary was not clearly seen.  The left ovary was normal.  No other additional findings were seen.  The warm heat was applied to the incision site.  Patient was ambulated without difficulty and by postoperative day #4 she had remained afebrile.  Her blood pressures had been about 130/70-80.  Her incision still had some ecchymosis but when probed with 10-15 cc serosanguineous fluid no evidence of infection from the incision site was found.  The patient was deemed well to be discharged home.  DISPOSITION:  Home.  CONDITION ON DISCHARGE:       Stable.  DISCHARGE MEDICATIONS:        1. Tylox #30 one to two tablets q.3-4h. p.r.n.                                  pain.                               2. Motrin 800 mg q.6h. p.r.n. pain.                               3. Niferex 150 mg p.o. b.i.d.  DISCHARGE INSTRUCTIONS:       Continue with warm heat to her incision q.4h. Per postpartum booklet as well as call if severe headache, visual changes, epigastric pain, right upper quadrant pain, increased edema.  FOLLOW-UP:                    Staple removal in the office on Friday with wound evaluation at that time.  Other followup at [facility] in four weeks postpartum. DD:  03/05/00 TD:  03/05/00 Job: [id number]

## 2010-05-30 NOTE — Op Note (Signed)
[facility]  Patient:    Alicia Alexander, Alicia Alexander                 MRN: [id number] Proc. Date: 01/30/00 Adm. Date:  [id number] Attending:  Maxie Better                           Operative Report  PREOPERATIVE DIAGNOSES;       1. Oligohydramnios.                               2. Breech presentation.                               3. Pregnancy induced hypertension.                               4. Term gestation.  POSTOPERATIVE DIAGNOSES:      1. Oligohydramnios.                               2. Breech presentation.                               3. Pregnancy induced hypertension.                               4. Term gestation.  PROCEDURE:                    1. Primary cesarean section.                               2. Sharl Ma hysterotomy.  SURGEON:                      Sheronette A. Cherly Hensen, M.D.  ASSISTANT:                    Marina Gravel, M.D.  ANESTHESIA:                   Spinal.  INDICATIONS:                  A 35 year old gravida 2, para 0 female at 38+ weeks gestation who was scheduled for a cesarean section on February 02, 2000 secondary to breech presentation; however, the patient was found to have oligohydramnios on January 30, 2000 with still a persistent breech and therefore, is now being admitted for a primary cesarean section. Patients prenatal course has been complicated by pregnancy induced hypertension diagnosed during the latter part of her third trimester. Her PIH labs have been normal with the exception of slight elevation of the uric acid, last study being done on January 30, 2000. Patient also had been monitored with antenatal testing with the last reactive nonstress test on January 30, 2000. Risk and benefit for the procedure have been explained to the patient and her husband. Consent was signed. The patient was transferred to the operating room.  DESCRIPTION OF PROCEDURE:     Under adequate spinal anesthesia, the  patient was placed in a supine  position with a left lateral tilt. The patient was sterilely prepped and draped in the usual fashion. An indwelling Foley catheter was placed sterilely. A Pfannenstiel skin incision was then made, carried down sharply to the rectus fascia. The rectus fascia was incised in midline and extended bilaterally. The rectus fascia was then bluntly and with cautery dissected off the rectus muscle, superior and inferior fashion. The rectus muscles are split in the midline. The parietal peritoneum was bluntly entered and extended transversely in small areas inferiorly. There was notable prominent vessels in the lower uterine segment. The vesicouterine peritoneum was then created. The bladder was then bluntly dissected off the lower uterine segment and displaced from the operative field using a Doyen retractor. A curvilinear low transverse incision was then made and extended bluntly. A small amount of amniotic fluid was noted. Baby was presenting as in an Indian-style fashion. The feet were grasped individually and a live female infant was delivered using the breech maneuvers. Baby was bulb suctioned on the abdomen and cord was then clamped and cut. The baby was transferred to the awaiting pediatricians who subsequently assigned Apgars of 8 and 9 at one and five minutes. Placenta was spontaneous, intact, and not sent to pathology. Uterus was exteriorized. Uterine cavity was cleaned of debris. It was noted that the fundus had a heart-shaped appearance with the left aspect of the uterus having much more development noted; thought to be secondary to the head being in that area throughout this time. Tubes and ovaries are noted to be normal. Uterine incision was closed in two layers. The first layer was a running lock stitch of 0 Monocryl. The second layer was imbricated using 0 Monocryl suture. The uterus was then returned to the abdomen. Small bleeders were cauterized. Bladder  was inspected, no bleeders noted. The paracolic gutters were cleaned of debris. The parietal peritoneum was not closed. Inspection of the rectus fascia and the rectus muscles showed no bleeders. The rectus fascia was then closed with 0 Vicryl x 2. The subcutaneous area was irrigated, small bleeders cauterized, and 3-0 plain suture was then used to approximate the inferior part of the subcutaneous fat. The skin was injected with 0.25% Marcaine and approximated using Ethicon staples.  SPECIMENS:                    Placenta, not sent to pathology.  ESTIMATED BLOOD LOSS:         800 cc.  URINE OUTPUT:                 400 cc clear yellow urine.  INTRAOPERATIVE FLUID:         2600 cc.  COUNTS:                       Sponge and instrument counts x 2 are correct.  COMPLICATIONS:                None.  DISPOSITION:                  Patient tolerated the procedure well and was transferred to the recovery room in stable condition. DD:  01/30/00 TD:  01/31/00 Job: [id number]

## 2010-06-14 ENCOUNTER — Emergency Department (HOSPITAL_BASED_OUTPATIENT_CLINIC_OR_DEPARTMENT_OTHER)
Admission: EM | Admit: 2010-06-14 | Discharge: 2010-06-14 | Disposition: A | Discharge status: Home / Self Care | Payer: BC Managed Care – PPO | Attending: Emergency Medicine | Admitting: Emergency Medicine

## 2010-06-14 ENCOUNTER — Emergency Department (INDEPENDENT_AMBULATORY_CARE_PROVIDER_SITE_OTHER): Payer: BC Managed Care – PPO

## 2010-06-14 DIAGNOSIS — M25579 Pain in unspecified ankle and joints of unspecified foot: Secondary | ICD-10-CM

## 2010-06-14 DIAGNOSIS — S82899A Other fracture of unspecified lower leg, initial encounter for closed fracture: Secondary | ICD-10-CM

## 2010-06-14 DIAGNOSIS — S8263XA Displaced fracture of lateral malleolus of unspecified fibula, initial encounter for closed fracture: Secondary | ICD-10-CM | POA: Insufficient documentation

## 2010-06-14 DIAGNOSIS — W098XXA Fall on or from other playground equipment, initial encounter: Secondary | ICD-10-CM | POA: Insufficient documentation

## 2010-06-14 DIAGNOSIS — W19XXXA Unspecified fall, initial encounter: Secondary | ICD-10-CM

## 2010-10-13 LAB — BASIC METABOLIC PANEL
Chloride: 104
Creatinine, Ser: 0.46
GFR calc Af Amer: >60
GFR calc non Af Amer: >60
Glucose, Bld: 127 — ABNORMAL HIGH
Potassium: 3.2 — ABNORMAL LOW
Sodium: 137

## 2010-10-13 LAB — PREGNANCY, URINE: Preg Test, Ur: NEGATIVE

## 2010-10-13 LAB — CBC
HCT: 33.6 — ABNORMAL LOW
Hemoglobin: 13.3
MCHC: 34.9
MCV: 91.9
MCV: 93.4
Platelets: 234
Platelets: 283
RBC: 4.16
RDW: 13

## 2012-12-12 ENCOUNTER — Other Ambulatory Visit: Payer: Self-pay | Admitting: Gynecology

## 2012-12-12 DIAGNOSIS — L309 Dermatitis, unspecified: Secondary | ICD-10-CM

## 2012-12-12 MED ORDER — DESOXIMETASONE 0.05 % EX OINT: TOPICAL_OINTMENT | CUTANEOUS | Status: DC

## 2013-06-06 ENCOUNTER — Other Ambulatory Visit: Payer: Self-pay

## 2013-06-06 DIAGNOSIS — Z1231 Encounter for screening mammogram for malignant neoplasm of breast: Secondary | ICD-10-CM

## 2013-06-08 ENCOUNTER — Encounter: Payer: Self-pay | Admitting: Obstetrics and Gynecology

## 2013-06-08 ENCOUNTER — Ambulatory Visit (INDEPENDENT_AMBULATORY_CARE_PROVIDER_SITE_OTHER): Payer: Managed Care, Other (non HMO) | Admitting: Obstetrics and Gynecology

## 2013-06-08 VITALS — BP 110/76 | HR 76 | Ht 65.0 in | Wt 176.8 lb

## 2013-06-08 DIAGNOSIS — Z01419 Encounter for gynecological examination (general) (routine) without abnormal findings: Secondary | ICD-10-CM

## 2013-06-08 DIAGNOSIS — Z0000 Encounter for general adult medical examination without abnormal findings: Secondary | ICD-10-CM

## 2013-06-08 LAB — COMPREHENSIVE METABOLIC PANEL
ALBUMIN: 4.2 g/dL (ref 3.5–5.2)
ALK PHOS: 62 U/L (ref 39–117)
ALT: 15 U/L (ref 0–35)
AST: 13 U/L (ref 0–37)
BILIRUBIN TOTAL: 0.5 mg/dL (ref 0.2–1.2)
BUN: 9 mg/dL (ref 6–23)
CALCIUM: 9.1 mg/dL (ref 8.4–10.5)
CO2: 25 mEq/L (ref 19–32)
Chloride: 104 mEq/L (ref 96–112)
Creat: 0.55 mg/dL (ref 0.50–1.10)
Glucose, Bld: 89 mg/dL (ref 70–99)
POTASSIUM: 4.3 meq/L (ref 3.5–5.3)
SODIUM: 138 meq/L (ref 135–145)
TOTAL PROTEIN: 7.1 g/dL (ref 6.0–8.3)

## 2013-06-08 LAB — POCT URINALYSIS DIPSTICK
BILIRUBIN UA: NEGATIVE
Glucose, UA: NEGATIVE
KETONES UA: NEGATIVE
LEUKOCYTES UA: NEGATIVE
NITRITE UA: NEGATIVE
PH UA: 5
PROTEIN UA: NEGATIVE
RBC UA: NEGATIVE
Urobilinogen, UA: NEGATIVE

## 2013-06-08 LAB — LIPID PANEL
Cholesterol: 123 mg/dL (ref 0–200)
HDL: 56 mg/dL (ref >39)
LDL Cholesterol: 50 mg/dL (ref 0–99)
TRIGLYCERIDES: 85 mg/dL (ref <150)
Total CHOL/HDL Ratio: 2.2 Ratio
VLDL: 17 mg/dL (ref 0–40)

## 2013-06-08 LAB — CBC
HEMATOCRIT: 40.1 % (ref 36.0–46.0)
Hemoglobin: 13.5 g/dL (ref 12.0–15.0)
MCH: 28.4 pg (ref 26.0–34.0)
MCHC: 33.7 g/dL (ref 30.0–36.0)
MCV: 84.4 fL (ref 78.0–100.0)
PLATELETS: 336 10*3/uL (ref 150–400)
RBC: 4.75 MIL/uL (ref 3.87–5.11)
RDW: 13.4 % (ref 11.5–15.5)
WBC: 5.5 10*3/uL (ref 4.0–10.5)

## 2013-06-08 NOTE — Patient Instructions (Signed)

## 2013-06-08 NOTE — Progress Notes (Signed)
Patient ID: Alicia Alexander, female   DOB: April 01, 1975, 38 y.o.   MRN: [id number] GYNECOLOGY VISIT  PCP:   None  Referring provider:   HPI: 38 y.o.   Married  Caucasian  female   [phone] with No LMP recorded. Patient has had a hysterectomy.   here for   AEX. Hysterectomy for menorrhagia.   Still has ovaries and tubes.  History of bicornuate uterus with a lost Mirena IUD. HTN on OCPs. Able to track cycles.  No hot flashes.   Some left breast soreness under left axillary region, sporadically.   No lump palpable.   Having vaginal discharge.   No itching or burning.  Can increase or decrease.  Decreases after intercourse.  Wearing a minipad. No urinary leakage.   History of hyperthyroidism with pregnancy. History of PIH in pregnancy.   Mom in hospice due to emphysema and COPD and CHF. She lives in [place]. Father has had a heart cath.  Unclear if had an MI.  History of diabetes.  Not a long contact with him.    Hgb:   Urine:  Neg  GYNECOLOGIC HISTORY: No LMP recorded. Patient has had a hysterectomy. Sexually active:  yes Partner preference: female Contraception: Hysterectomy   Menopausal hormone therapy:  DES exposure: no   Blood transfusions:  2 units of Packed RBCs.  2002--after first Cesarean Section.  Anemia post op and fainted.  Hgb was about 5.0. Sexually transmitted diseases: no   GYN procedures and prior surgeries:  c-section x2, colposcopy 2001, TVH 09/2007 Last mammogram:   Scheduled for 07-12-13, will be the first.               Last pap and high risk HPV testing:  05/2008 wnl  History of abnormal pap smear:  Hx of ascus pap and colposcopy  2001.  Had TCA treatments of the cervix. All follow up paps were normal.    OB History   Grav Para Term Preterm Abortions TAB SAB Ect Mult Living   3 2 2  1  1   2        LIFESTYLE: Exercise:   no            Tobacco:   no Alcohol:     1 drink per week Drug use:  no  OTHER HEALTH MAINTENANCE: Tetanus/TDap:   10/2005 Gardisil:              n/a Influenza:           09/2012 Zostavax:            n/a  Bone density:     n/a Colonoscopy:     n/a  Cholesterol check:   2010 wnl  Family History  Problem Relation Age of Onset  . COPD Mother   . Emphysema Mother   . Thyroid disease Mother     hypothyroid  . Atrial fibrillation Mother   . Osteopenia Mother   . Diabetes Father     AODM  . Heart attack Father   . Breast cancer Maternal Aunt   . Lupus Maternal Aunt   . Cancer Paternal Grandfather     dec--pancreatic ca    There are no active problems to display for this patient.  Past Medical History  Diagnosis Date  . Blood transfusion without reported diagnosis 01-31-00  . Anemia   . Thyroid disease     only with pregnancy  . Gestational diabetes   . Pregnancy induced hypertension   .  Abnormal Pap smear of cervix 2001    --ascus    Past Surgical History  Procedure Laterality Date  . Wisdom tooth extraction  1995  . Cesarean section  01/2000, 05/2005  . Abdominal hysterectomy  10-11-07    TVH--ovaries remain  . Colposcopy  2001    ALLERGIES: Review of patient's allergies indicates no known allergies.  Current Outpatient Prescriptions  Medication Sig Dispense Refill  . Desoximetasone 0.05 % OINT Pea size amount to affected area twice a day  15 g  0  . loratadine (CLARITIN) 10 MG tablet Take 10 mg by mouth daily.       No current facility-administered medications for this visit.     ROS:  Pertinent items are noted in HPI.  SOCIAL HISTORY:  CMA.  PHYSICAL EXAMINATION:    BP 110/76  Pulse 76  Ht 5\' 5"  (1.651 m)  Wt 176 lb 12.8 oz (80.196 kg)  BMI 29.42 kg/m2   Wt Readings from Last 3 Encounters:  06/08/13 176 lb 12.8 oz (80.196 kg)     Ht Readings from Last 3 Encounters:  06/08/13 5\' 5"  (1.651 m)    General appearance: alert, cooperative and appears stated age Head: Normocephalic, without obvious abnormality, atraumatic Neck: no adenopathy, supple, symmetrical,  trachea midline and thyroid not enlarged, symmetric, no tenderness/mass/nodules Lungs: clear to auscultation bilaterally Breasts: Inspection negative, No nipple retraction or dimpling, No nipple discharge or bleeding, No axillary or supraclavicular adenopathy, Normal to palpation without dominant masses Heart: regular rate and rhythm Abdomen: soft, non-tender; no masses,  no organomegaly Extremities: extremities normal, atraumatic, no cyanosis or edema Skin: Skin color, texture, turgor normal. No rashes or lesions Lymph nodes: Cervical, supraclavicular, and axillary nodes normal. No abnormal inguinal nodes palpated Neurologic: Grossly normal  Pelvic: External genitalia:  no lesions              Urethra:  normal appearing urethra with no masses, tenderness or lesions              Bartholins and Skenes: normal                 Vagina: normal appearing vagina with normal color, no lesions, yellowish discharge.               Cervix:  absent              Pap and high risk HPV testing done: yes.            Bimanual Exam:  Uterus:   absent                                      Adnexa: normal adnexa in size, nontender and no masses                                      Rectovaginal: Confirms                                      Anus:  normal sphincter tone, no lesions  ASSESSMENT  Normal gynecologic exam. Status post TVH. Remote history of ASCUS pap.   History of GDM and PIH.   PLAN  Mammogram recommended yearly.  [facility]  Pap  smear and high risk HPV testing performed.  Counseled on self breast exam, Calcium and vitamin D intake, exercise. FLP, CMP, CBC, TSH Return annually or prn   An After Visit Summary was printed and given to the patient.

## 2013-06-09 LAB — TSH: TSH: 1.485 u[IU]/mL (ref 0.350–4.500)

## 2013-06-12 LAB — IPS PAP TEST WITH HPV

## 2013-06-14 ENCOUNTER — Other Ambulatory Visit: Payer: Self-pay | Admitting: Obstetrics and Gynecology

## 2013-06-14 ENCOUNTER — Encounter: Payer: Self-pay | Admitting: Obstetrics and Gynecology

## 2013-06-14 MED ORDER — METRONIDAZOLE 500 MG PO TABS: 500.0000 mg | ORAL_TABLET | Freq: Two times a day (BID) | ORAL | Status: DC

## 2013-07-13 ENCOUNTER — Ambulatory Visit
Admission: RE | Admit: 2013-07-13 | Discharge: 2013-07-13 | Disposition: A | Discharge status: Home / Self Care | Payer: Managed Care, Other (non HMO) | Source: Ambulatory Visit

## 2013-07-13 DIAGNOSIS — Z1231 Encounter for screening mammogram for malignant neoplasm of breast: Secondary | ICD-10-CM

## 2013-09-07 ENCOUNTER — Encounter: Payer: Self-pay | Admitting: Obstetrics and Gynecology

## 2013-11-13 ENCOUNTER — Encounter: Payer: Self-pay | Admitting: Obstetrics and Gynecology

## 2014-06-13 ENCOUNTER — Ambulatory Visit: Payer: Managed Care, Other (non HMO) | Admitting: Obstetrics and Gynecology

## 2014-06-14 ENCOUNTER — Ambulatory Visit: Payer: Managed Care, Other (non HMO) | Admitting: Obstetrics and Gynecology

## 2014-06-28 ENCOUNTER — Other Ambulatory Visit: Payer: Self-pay

## 2014-06-28 DIAGNOSIS — Z1231 Encounter for screening mammogram for malignant neoplasm of breast: Secondary | ICD-10-CM

## 2014-07-26 ENCOUNTER — Ambulatory Visit
Admission: RE | Admit: 2014-07-26 | Discharge: 2014-07-26 | Disposition: A | Discharge status: Home / Self Care | Payer: BLUE CROSS/BLUE SHIELD | Source: Ambulatory Visit

## 2014-07-26 DIAGNOSIS — Z1231 Encounter for screening mammogram for malignant neoplasm of breast: Secondary | ICD-10-CM

## 2014-07-27 ENCOUNTER — Ambulatory Visit
Admission: RE | Admit: 2014-07-27 | Discharge: 2014-07-27 | Disposition: A | Discharge status: Home / Self Care | Payer: BLUE CROSS/BLUE SHIELD | Source: Ambulatory Visit | Attending: Obstetrics and Gynecology | Admitting: Obstetrics and Gynecology

## 2014-07-27 ENCOUNTER — Other Ambulatory Visit: Payer: Self-pay | Admitting: Obstetrics and Gynecology

## 2014-07-27 DIAGNOSIS — R928 Other abnormal and inconclusive findings on diagnostic imaging of breast: Secondary | ICD-10-CM

## 2014-08-02 ENCOUNTER — Ambulatory Visit: Payer: BLUE CROSS/BLUE SHIELD | Admitting: Obstetrics and Gynecology

## 2014-08-02 ENCOUNTER — Ambulatory Visit (INDEPENDENT_AMBULATORY_CARE_PROVIDER_SITE_OTHER): Payer: BLUE CROSS/BLUE SHIELD | Admitting: Obstetrics and Gynecology

## 2014-08-02 ENCOUNTER — Encounter: Payer: Self-pay | Admitting: Obstetrics and Gynecology

## 2014-08-02 VITALS — BP 128/82 | HR 70 | Resp 18 | Ht 65.0 in | Wt 177.6 lb

## 2014-08-02 DIAGNOSIS — N951 Menopausal and female climacteric states: Secondary | ICD-10-CM | POA: Diagnosis not present

## 2014-08-02 DIAGNOSIS — R300 Dysuria: Secondary | ICD-10-CM

## 2014-08-02 DIAGNOSIS — Z0000 Encounter for general adult medical examination without abnormal findings: Secondary | ICD-10-CM

## 2014-08-02 DIAGNOSIS — Z01419 Encounter for gynecological examination (general) (routine) without abnormal findings: Secondary | ICD-10-CM | POA: Diagnosis not present

## 2014-08-02 DIAGNOSIS — N952 Postmenopausal atrophic vaginitis: Secondary | ICD-10-CM

## 2014-08-02 LAB — COMPREHENSIVE METABOLIC PANEL
ALK PHOS: 79 U/L (ref 39–117)
ALT: 15 U/L (ref 0–35)
AST: 14 U/L (ref 0–37)
Albumin: 3.7 g/dL (ref 3.5–5.2)
BILIRUBIN TOTAL: 0.3 mg/dL (ref 0.2–1.2)
BUN: 15 mg/dL (ref 6–23)
CHLORIDE: 102 meq/L (ref 96–112)
CO2: 21 meq/L (ref 19–32)
Calcium: 9.1 mg/dL (ref 8.4–10.5)
Creat: 0.54 mg/dL (ref 0.50–1.10)
GLUCOSE: 81 mg/dL (ref 70–99)
Potassium: 4.3 mEq/L (ref 3.5–5.3)
SODIUM: 137 meq/L (ref 135–145)
Total Protein: 7.3 g/dL (ref 6.0–8.3)

## 2014-08-02 LAB — POCT URINALYSIS DIPSTICK
Bilirubin, UA: NEGATIVE
Blood, UA: NEGATIVE
GLUCOSE UA: NEGATIVE
Ketones, UA: NEGATIVE
LEUKOCYTES UA: NEGATIVE
Nitrite, UA: NEGATIVE
Protein, UA: NEGATIVE
Urobilinogen, UA: NEGATIVE
pH, UA: 5

## 2014-08-02 LAB — CBC
HEMATOCRIT: 42.3 % (ref 36.0–46.0)
Hemoglobin: 13.8 g/dL (ref 12.0–15.0)
MCH: 27.7 pg (ref 26.0–34.0)
MCHC: 32.6 g/dL (ref 30.0–36.0)
MCV: 84.9 fL (ref 78.0–100.0)
MPV: 11.4 fL (ref 8.6–12.4)
Platelets: 337 10*3/uL (ref 150–400)
RBC: 4.98 MIL/uL (ref 3.87–5.11)
RDW: 13.8 % (ref 11.5–15.5)
WBC: 6.4 10*3/uL (ref 4.0–10.5)

## 2014-08-02 LAB — LIPID PANEL
CHOL/HDL RATIO: 2.1 ratio
CHOLESTEROL: 111 mg/dL (ref 0–200)
HDL: 52 mg/dL (ref >=46)
LDL Cholesterol: 44 mg/dL (ref 0–99)
Triglycerides: 75 mg/dL (ref <150)
VLDL: 15 mg/dL (ref 0–40)

## 2014-08-02 LAB — HEMOGLOBIN, FINGERSTICK: HEMOGLOBIN, FINGERSTICK: 13.8 g/dL (ref 12.0–16.0)

## 2014-08-02 MED ORDER — ESTROGENS, CONJUGATED 0.625 MG/GM VA CREA: TOPICAL_CREAM | VAGINAL | Status: DC

## 2014-08-02 NOTE — Patient Instructions (Signed)

## 2014-08-02 NOTE — Progress Notes (Addendum)
Patient ID: Alicia Alexander, female   DOB: May 29, 1975, 39 y.o.   MRN: [id number] 39 y.o. G9P2012 Married Caucasian female here for annual exam.    Hx of gestational diabetes.   Dysuria. Just does not feel right.  No usual UTI type pain.  Drinking a lot of caffeine. Vaginal dryness with intercourse.   Noting night sweats.   PCP:   None  No LMP recorded. Patient has had a hysterectomy.          Sexually active: Yes.   female The current method of family planning is status post hysterectomy.    Exercising: Yes.    walking 3 miles or 45 minutes per week. Smoker:  no  Health Maintenance: Pap:  5/ 28/15 - negative for SIL, negative HR HPV.  Inflammation, positive for BV. History of abnormal Pap:  Yes, 2001 Ascus pap with Colposcopy and had TCA treatment to cervix.  No documented dysplasia of cervix per patient.  Final hyst pathology - benign cervix with chronic cervicitis.  MMG:  07-26-14 Density Cat:D/Poss.mass Rt.Br.  Pt.had Diag.and US revealing likely benign 5x3x70mm mass within upper inner Rt.Br.,which may represent the mammographic finding.  As both mammographic and sonographic findings appear likely benign, 6 mo. F/u recommended:[facility]. Colonoscopy:  n/a BMD:   n/a  Result  n/a TDaP:  10/2005 Screening Labs:  Hb today: 13.8, Urine today: Neg   reports that she has never smoked. She does not have any smokeless tobacco history on file. She reports that she drinks alcohol. She reports that she does not use illicit drugs.  Past Medical History  Diagnosis Date  . Blood transfusion without reported diagnosis 01-31-00  . Anemia   . Thyroid disease     only with pregnancy  . Gestational diabetes   . Pregnancy induced hypertension   . Abnormal Pap smear of cervix 2001    --ascus    Past Surgical History  Procedure Laterality Date  . Wisdom tooth extraction  1995  . Cesarean section  01/2000, 05/2005  . Abdominal hysterectomy  10-11-07    TVH--ovaries remain  . Colposcopy   2001    Current Outpatient Prescriptions  Medication Sig Dispense Refill  . loratadine (CLARITIN) 10 MG tablet Take 10 mg by mouth daily.     No current facility-administered medications for this visit.    Family History  Problem Relation Age of Onset  . COPD Mother   . Emphysema Mother   . Thyroid disease Mother     hypothyroid  . Atrial fibrillation Mother   . Osteopenia Mother   . Diabetes Father     AODM  . Heart attack Father   . Breast cancer Maternal Aunt   . Lupus Maternal Aunt   . Cancer Paternal Grandfather     dec--pancreatic ca    ROS:  Pertinent items are noted in HPI.  Otherwise, a comprehensive ROS was negative.  Exam:   BP 128/82 mmHg  Pulse 70  Resp 18  Ht 5\' 5"  (1.651 m)  Wt 177 lb 9.6 oz (80.559 kg)  BMI 29.55 kg/m2    General appearance: alert, cooperative and appears stated age Head: Normocephalic, without obvious abnormality, atraumatic Neck: no adenopathy, supple, symmetrical, trachea midline and thyroid normal to inspection and palpation Lungs: clear to auscultation bilaterally Breasts: normal appearance, no masses or tenderness, Inspection negative, No nipple retraction or dimpling, No nipple discharge or bleeding, No axillary or supraclavicular adenopathy Heart: regular rate and rhythm Abdomen: soft, non-tender; bowel  sounds normal; no masses,  no organomegaly Extremities: extremities normal, atraumatic, no cyanosis or edema Skin: Skin color, texture, turgor normal. No rashes or lesions Lymph nodes: Cervical, supraclavicular, and axillary nodes normal. No abnormal inguinal nodes palpated Neurologic: Grossly normal  Pelvic: External genitalia:  no lesions              Urethra:  normal appearing urethra with no masses, tenderness or lesions              Bartholins and Skenes: normal                 Vagina: generalized erythema at cuff without lesions.                Cervix: absent              Pap taken: No. Bimanual Exam:  Uterus:   uterus absent              Adnexa: normal adnexa and no mass, fullness, tenderness              Rectovaginal: Yes.  .  Confirms.              Anus:  normal sphincter tone, no lesions  Chaperone was present for exam.  Assessment:   Well woman visit with normal exam. Status post TVH. Menopausal symptoms.  Atrophic vaginitis.  Dysuria.  Hx GDM. Remote hx of abnormal pap and TCA to cervix for ASCUS. Final pathology on hysterectomy showed benign cervix.   Plan: Yearly mammogram recommended after age 51.  Recommended self breast exam.  Pap and HR HPV as above. Discussed Calcium, Vitamin D, regular exercise program including cardiovascular exercise. Labs performed.  Yes.  .   See orders.  Routine labs including CMP, CBC, lipid profile, FSH, estradiol, urine dip now.  Refills given on medications.  Yes.  .  See orders.  Premarin vaginal cream.  Instructed in use and potential low risk of thromboembolic events and stimulation of breast CA. No Rx for ERT at this time.  Recheck in 3 months.  Follow up annually and prn.     After visit summary provided.

## 2014-08-03 ENCOUNTER — Telehealth: Payer: Self-pay

## 2014-08-03 ENCOUNTER — Other Ambulatory Visit: Payer: Self-pay | Admitting: Obstetrics and Gynecology

## 2014-08-03 LAB — ESTRADIOL: Estradiol: 134.9 pg/mL

## 2014-08-03 LAB — FOLLICLE STIMULATING HORMONE: FSH: 2.6 m[IU]/mL

## 2014-08-03 MED ORDER — ESTRADIOL 10 MCG VA TABS: ORAL_TABLET | VAGINAL | Status: DC

## 2014-08-03 NOTE — Telephone Encounter (Signed)
Called and notified patient Rx for Vagifem sent to pharmacy.

## 2014-08-03 NOTE — Telephone Encounter (Signed)
Patient called stating Premarin vaginal cream extremely expensive($280).  She wants to know if we could prescribe generic Vagifem suppositories instead?  Advised patient will discuss with Dr. Quincy Simmonds and let her know.  Confirmed [facility] [employer].  Routed to Dr. Quincy Simmonds.

## 2014-08-03 NOTE — Telephone Encounter (Signed)
Ok for generic Vagifem 10 mcg tablets.  Use nightly for 2 weeks and then 2 times per week. I will send in Rx to her pharmacy.   I will cancel the order for the Premarin cream.

## 2014-11-08 ENCOUNTER — Ambulatory Visit: Payer: BLUE CROSS/BLUE SHIELD | Admitting: Obstetrics and Gynecology

## 2014-11-09 ENCOUNTER — Ambulatory Visit (INDEPENDENT_AMBULATORY_CARE_PROVIDER_SITE_OTHER): Payer: BLUE CROSS/BLUE SHIELD | Admitting: Obstetrics and Gynecology

## 2014-11-09 ENCOUNTER — Encounter: Payer: Self-pay | Admitting: Obstetrics and Gynecology

## 2014-11-09 VITALS — BP 132/80 | HR 68 | Ht 65.0 in | Wt 169.2 lb

## 2014-11-09 DIAGNOSIS — N952 Postmenopausal atrophic vaginitis: Secondary | ICD-10-CM

## 2014-11-09 NOTE — Progress Notes (Signed)
Patient ID: Alicia Alexander, female   DOB: 1975-07-09, 39 y.o.   MRN: [id number] GYNECOLOGY  VISIT   HPI: 39 y.o.   Married  Caucasian  female   [phone] with No LMP recorded. Patient has had a hysterectomy.   here for 3 month follow up after beginning Premarin vaginal cream.    Using Premarin cream 1/2 gram every 4 - 5 days.  Notes improvement in a left external area where she was having a fissure.   Feels less hot flashes.  No real breast tenderness.   Going to [place] for her weeding anniversary.   GYNECOLOGIC HISTORY: No LMP recorded. Patient has had a hysterectomy.   Due for mammogram follow up in January 2017.  Benign right breast mass noted 07/27/14 on diagnostic mammogram and ultrasound.         OB History    Gravida Para Term Preterm AB TAB SAB Ectopic Multiple Living   3 2 2  1  1   2          There are no active problems to display for this patient.   Past Medical History  Diagnosis Date  . Blood transfusion without reported diagnosis 01-31-00  . Anemia   . Thyroid disease     only with pregnancy  . Gestational diabetes   . Pregnancy induced hypertension   . Abnormal Pap smear of cervix 2001    --ascus    Past Surgical History  Procedure Laterality Date  . Wisdom tooth extraction  1995  . Cesarean section  01/2000, 05/2005  . Abdominal hysterectomy  10-11-07    TVH--ovaries remain  . Colposcopy  2001    Current Outpatient Prescriptions  Medication Sig Dispense Refill  . loratadine (CLARITIN) 10 MG tablet Take 10 mg by mouth daily.    Marland Kitchen PREMARIN vaginal cream   1   No current facility-administered medications for this visit.     ALLERGIES: Review of patient's allergies indicates no known allergies.  Family History  Problem Relation Age of Onset  . COPD Mother   . Emphysema Mother   . Thyroid disease Mother     hypothyroid  . Atrial fibrillation Mother   . Osteopenia Mother   . Diabetes Father     AODM  . Heart attack Father   . Breast  cancer Maternal Aunt   . Lupus Maternal Aunt   . Cancer Paternal Grandfather     dec--pancreatic ca    Social History   Social History  . Marital Status: Married    Spouse Name: N/A  . Number of Children: N/A  . Years of Education: N/A   Occupational History  . Not on file.   Social History Main Topics  . Smoking status: Never Smoker   . Smokeless tobacco: Not on file  . Alcohol Use: 0.0 oz/week    0 Standard drinks or equivalent per week     Comment: occ.  . Drug Use: No  . Sexual Activity:    Partners: Male    Birth Control/ Protection: Surgical     Comment: TVH 2009   Other Topics Concern  . Not on file   Social History Narrative    ROS:  Pertinent items are noted in HPI.  PHYSICAL EXAMINATION:    BP 132/80 mmHg  Pulse 68  Ht 5\' 5"  (1.651 m)  Wt 169 lb 3.2 oz (76.749 kg)  BMI 28.16 kg/m2    General appearance: alert, cooperative and appears stated age  Pelvic:  External genitalia:  no lesions              Urethra:  normal appearing urethra with no masses, tenderness or lesions              Bartholins and Skenes: normal                 Vagina: normal appearing vagina with mild erythema at vaginal apex.  Good rugae. Whitish yellow vaginal discharge.               Cervix: absent              Bimanual Exam:  Uterus:  uterus absent              Adnexa: no mass, fullness, tenderness              Chaperone was present for exam.  ASSESSMENT  Vaginal atrophy.  Improved on Premarin vaginal cream.   PLAN  Try to use Premarin 1/2 gm pv at hs twice weekly, i.e. Every 3 - 4 days.  Discussed Estring as an option if the cream is difficult to use with regularity.  Complete follow up breast imaging in January.  Follow up for annual exam and prn.   An After Visit Summary was printed and given to the patient.  ___15___ minutes face to face time of which over 50% was spent in counseling.

## 2014-11-21 ENCOUNTER — Other Ambulatory Visit: Payer: Self-pay | Admitting: Gastroenterology

## 2014-11-21 DIAGNOSIS — R1011 Right upper quadrant pain: Secondary | ICD-10-CM

## 2014-12-05 ENCOUNTER — Ambulatory Visit (HOSPITAL_COMMUNITY)
Admission: RE | Admit: 2014-12-05 | Discharge: 2014-12-05 | Disposition: A | Discharge status: Home / Self Care | Payer: BLUE CROSS/BLUE SHIELD | Source: Ambulatory Visit | Attending: Gastroenterology | Admitting: Gastroenterology

## 2014-12-05 ENCOUNTER — Encounter (HOSPITAL_COMMUNITY)
Admission: RE | Admit: 2014-12-05 | Discharge: 2014-12-05 | Disposition: A | Discharge status: Home / Self Care | Payer: BLUE CROSS/BLUE SHIELD | Source: Ambulatory Visit | Attending: Gastroenterology | Admitting: Gastroenterology

## 2014-12-05 DIAGNOSIS — R1011 Right upper quadrant pain: Secondary | ICD-10-CM | POA: Diagnosis not present

## 2014-12-05 DIAGNOSIS — K824 Cholesterolosis of gallbladder: Secondary | ICD-10-CM | POA: Diagnosis not present

## 2014-12-05 MED ORDER — TECHNETIUM TC 99M MEBROFENIN IV KIT
5.5000 | PACK | Freq: Once | INTRAVENOUS | Status: DC | PRN
  Administered 2014-12-05: 6 via INTRAVENOUS
  Filled 2014-12-05: qty 6

## 2014-12-05 MED ORDER — SINCALIDE 5 MCG IJ SOLR
0.0200 ug/kg | Freq: Once | INTRAMUSCULAR | Status: AC
  Administered 2014-12-05: 1.5 ug via INTRAVENOUS

## 2014-12-11 ENCOUNTER — Other Ambulatory Visit: Payer: Self-pay | Admitting: Obstetrics and Gynecology

## 2014-12-11 DIAGNOSIS — N6310 Unspecified lump in the right breast, unspecified quadrant: Secondary | ICD-10-CM

## 2015-01-24 DIAGNOSIS — R110 Nausea: Secondary | ICD-10-CM | POA: Diagnosis not present

## 2015-01-24 DIAGNOSIS — R1013 Epigastric pain: Secondary | ICD-10-CM | POA: Diagnosis not present

## 2015-01-25 ENCOUNTER — Other Ambulatory Visit: Payer: Self-pay | Admitting: General Surgery

## 2015-01-25 DIAGNOSIS — R110 Nausea: Secondary | ICD-10-CM

## 2015-01-28 ENCOUNTER — Other Ambulatory Visit: Payer: Self-pay | Admitting: General Surgery

## 2015-01-28 DIAGNOSIS — R1013 Epigastric pain: Secondary | ICD-10-CM

## 2015-01-31 ENCOUNTER — Ambulatory Visit
Admission: RE | Admit: 2015-01-31 | Discharge: 2015-01-31 | Disposition: A | Discharge status: Home / Self Care | Payer: 59 | Source: Ambulatory Visit | Attending: Obstetrics and Gynecology | Admitting: Obstetrics and Gynecology

## 2015-01-31 DIAGNOSIS — N6310 Unspecified lump in the right breast, unspecified quadrant: Secondary | ICD-10-CM

## 2015-01-31 DIAGNOSIS — N63 Unspecified lump in breast: Secondary | ICD-10-CM | POA: Diagnosis not present

## 2015-02-07 ENCOUNTER — Ambulatory Visit
Admission: RE | Admit: 2015-02-07 | Discharge: 2015-02-07 | Disposition: A | Discharge status: Home / Self Care | Payer: 59 | Source: Ambulatory Visit | Attending: General Surgery | Admitting: General Surgery

## 2015-02-07 DIAGNOSIS — R1013 Epigastric pain: Secondary | ICD-10-CM | POA: Diagnosis not present

## 2015-02-07 MED ORDER — IOPAMIDOL (ISOVUE-300) INJECTION 61%
100.0000 mL | Freq: Once | INTRAVENOUS | Status: AC | PRN
Start: 2015-02-07 — End: 2015-02-07
  Administered 2015-02-07: 100 mL via INTRAVENOUS

## 2015-06-04 ENCOUNTER — Ambulatory Visit: Payer: Self-pay | Admitting: General Surgery

## 2015-06-06 ENCOUNTER — Encounter (HOSPITAL_COMMUNITY)
Admission: RE | Admit: 2015-06-06 | Discharge: 2015-06-06 | Disposition: A | Discharge status: Home / Self Care | Payer: 59 | Source: Ambulatory Visit | Attending: General Surgery | Admitting: General Surgery

## 2015-06-06 ENCOUNTER — Encounter (HOSPITAL_COMMUNITY): Payer: Self-pay

## 2015-06-06 DIAGNOSIS — K8050 Calculus of bile duct without cholangitis or cholecystitis without obstruction: Secondary | ICD-10-CM | POA: Insufficient documentation

## 2015-06-06 DIAGNOSIS — Z01812 Encounter for preprocedural laboratory examination: Secondary | ICD-10-CM | POA: Insufficient documentation

## 2015-06-06 HISTORY — DX: Headache: R51

## 2015-06-06 HISTORY — DX: Headache, unspecified: R51.9

## 2015-06-06 HISTORY — DX: Thyrotoxicosis, unspecified without thyrotoxic crisis or storm: E05.90

## 2015-06-06 LAB — CBC
HCT: 39.6 % (ref 36.0–46.0)
HEMOGLOBIN: 12.9 g/dL (ref 12.0–15.0)
MCH: 27.5 pg (ref 26.0–34.0)
MCHC: 32.6 g/dL (ref 30.0–36.0)
MCV: 84.4 fL (ref 78.0–100.0)
Platelets: 291 10*3/uL (ref 150–400)
RBC: 4.69 MIL/uL (ref 3.87–5.11)
RDW: 13.4 % (ref 11.5–15.5)
WBC: 6.1 10*3/uL (ref 4.0–10.5)

## 2015-06-06 LAB — COMPREHENSIVE METABOLIC PANEL
ALK PHOS: 63 U/L (ref 38–126)
ALT: 13 U/L — ABNORMAL LOW (ref 14–54)
ANION GAP: 7 (ref 5–15)
AST: 14 U/L — ABNORMAL LOW (ref 15–41)
Albumin: 3.6 g/dL (ref 3.5–5.0)
BILIRUBIN TOTAL: 0.5 mg/dL (ref 0.3–1.2)
BUN: 11 mg/dL (ref 6–20)
CALCIUM: 9.1 mg/dL (ref 8.9–10.3)
CO2: 23 mmol/L (ref 22–32)
Chloride: 106 mmol/L (ref 101–111)
Creatinine, Ser: 0.57 mg/dL (ref 0.44–1.00)
Glucose, Bld: 126 mg/dL — ABNORMAL HIGH (ref 65–99)
Potassium: 3.8 mmol/L (ref 3.5–5.1)
SODIUM: 136 mmol/L (ref 135–145)
TOTAL PROTEIN: 6.8 g/dL (ref 6.5–8.1)

## 2015-06-06 NOTE — Pre-Procedure Instructions (Signed)
JESSIA LUSCO  06/06/2015      [facility] - [place], [place] [address] Phone: [phone] Fax: [phone]    Your procedure is scheduled on  Thursday  06/13/15  Report to [facility] Admitting at 530 A.M.  Call this number if you have problems the morning of surgery:  [phone]   Remember:  Do not eat food or drink liquids after midnight.  Take these medicines the morning of surgery with A SIP OF WATER  NONE     (STOP 7 DAYS PRIOR TO SURGERY ASPIRIN, ASPIRIN PRODUCTS, IBUPROFEN, ADVIL, MOTRIN, GOODY POWDERS, BC'S ,HERBAL MEDICINES)   Do not wear jewelry, make-up or nail polish.  Do not wear lotions, powders, or perfumes.  You may wear deodorant.  Do not shave 48 hours prior to surgery.  Men may shave face and neck.  Do not bring valuables to the hospital.  [facility] is not responsible for any belongings or valuables.  Contacts, dentures or bridgework may not be worn into surgery.  Leave your suitcase in the car.  After surgery it may be brought to your room.  For patients admitted to the hospital, discharge time will be determined by your treatment team.  Patients discharged the day of surgery will not be allowed to drive home.   Name and phone number of your driver:   Special instructions:  [facility] - Preparing for Surgery  Before surgery, you can play an important role.  Because skin is not sterile, your skin needs to be as free of germs as possible.  You can reduce the number of germs on you skin by washing with CHG (chlorahexidine gluconate) soap before surgery.  CHG is an antiseptic cleaner which kills germs and bonds with the skin to continue killing germs even after washing.  Please DO NOT use if you have an allergy to CHG or antibacterial soaps.  If your skin becomes reddened/irritated stop using the CHG and inform your nurse when you arrive at Short Stay.  Do not  shave (including legs and underarms) for at least 48 hours prior to the first CHG shower.  You may shave your face.  Please follow these instructions carefully:   1.  Shower with CHG Soap the night before surgery and the                                morning of Surgery.  2.  If you choose to wash your hair, wash your hair first as usual with your       normal shampoo.  3.  After you shampoo, rinse your hair and body thoroughly to remove the                      Shampoo.  4.  Use CHG as you would any other liquid soap.  You can apply chg directly       to the skin and wash gently with scrungie or a clean washcloth.  5.  Apply the CHG Soap to your body ONLY FROM THE NECK DOWN.        Do not use on open wounds or open sores.  Avoid contact with your eyes,       ears, mouth and genitals (private parts).  Wash genitals (private parts)       with your  normal soap.  6.  Wash thoroughly, paying special attention to the area where your surgery        will be performed.  7.  Thoroughly rinse your body with warm water from the neck down.  8.  DO NOT shower/wash with your normal soap after using and rinsing off       the CHG Soap.  9.  Pat yourself dry with a clean towel.            10.  Wear clean pajamas.            11.  Place clean sheets on your bed the night of your first shower and do not        sleep with pets.  Day of Surgery  Do not apply any lotions/deoderants the morning of surgery.  Please wear clean clothes to the hospital/surgery center.    Please read over the following fact sheets that you were given. Pain Booklet, Coughing and Deep Breathing and Surgical Site Infection Prevention

## 2015-06-12 MED ORDER — CEFOTETAN DISODIUM-DEXTROSE 2-2.08 GM-% IV SOLR
2.0000 g | INTRAVENOUS | Status: AC
  Administered 2015-06-13: 2 g via INTRAVENOUS
  Filled 2015-06-12: qty 50

## 2015-06-12 NOTE — H&P (Signed)
Alicia Alexander Location: [facility] Patient #: [id number] DOB: Sep 07, 1975 Married / Language: English / Race: White Female   History of Present Illness  The patient is a 40 year old female who presents with abdominal pain. She is referred by Dr. Benson Alexander for evaluation of upper abdominal pain. I do not have a copy of his clinic notes. Apparently she states that she started developing some epigastric pain in November associated with nausea. It would radiate to her right side. Occasionally she would have emesis with it. She describes that it generally is an achy pain sometimes sharp. She has nausea most days. She underwent an abdominal ultrasound that showed no evidence of gallstones except perhaps a 4 mm polyp otherwise her ultrasound was negative. She underwent an upper endoscopy which showed a 5 cm hiatal hernia with normal esophageal, gastric, and duodenal mucosa. She underwent a nuclear medicine scan which showed a normal gallbladder ejection fraction of greater than 97%. She had some slight pain with infusion of the CCK. She also states that physical activity will reproduce her symptoms. She states that bending, moving, lifting, will cause the discomfort and nausea. She underwent a trial of dexilant which helped with the indigestion but she also developed a rash. She reports about a 5 pound weight loss. She denies any diarrhea, constipation, acholic stools. She denies any NSAID or alcohol use. Occasionally she may feel that food gets stuck in her upper chest.  She underwent a CT scan after this appointment. There is a small hiatal hernia. However I don't think it is large enough to cause her symptoms.  Problem List/Past Medical Alicia Alexander EPIGASTRIC PAIN (R10.13) NAUSEA (R11.0)  Other Problems Gastroesophageal Reflux Disease Migraine Headache Transfusion history  Past Surgical History  Cesarean Section - Multiple Hysterectomy (not due to cancer) -  Complete Oral Surgery  Diagnostic Studies History Colonoscopy never Mammogram within last year Pap Smear 1-5 years ago  Allergies Zofran *ANTIEMETICS* Rash.  Medication History Claritin (10MG  Tablet, Oral) Active. Premarin (0.625MG /GM Cream, Vaginal) Active. Medications Reconciled  Social History  Alcohol use Occasional alcohol use. Caffeine use Carbonated beverages, Coffee, Tea. No drug use Tobacco use Never smoker.  Family History Depression Mother. Diabetes Mellitus Father. Heart Disease Mother. Heart disease in female family member before age 60 Hypertension Mother. Migraine Headache Mother, Son. Respiratory Condition Mother. Thyroid problems Mother.  Pregnancy / Birth History Age at menarche 46 years. Contraceptive History Depo-provera, Intrauterine device, Oral contraceptives. Gravida 3 Maternal age 78-20 Para 2    Review of Systems  General Present- Night Sweats and Weight Loss. Not Present- Appetite Loss, Chills, Fatigue, Fever and Weight Gain. Skin Present- Rash. Not Present- Change in Wart/Mole, Dryness, Hives, Jaundice, New Lesions, Non-Healing Wounds and Ulcer. HEENT Present- Wears glasses/contact lenses. Not Present- Earache, Hearing Loss, Hoarseness, Nose Bleed, Oral Ulcers, Ringing in the Ears, Seasonal Allergies, Sinus Pain, Sore Throat, Visual Disturbances and Yellow Eyes. Respiratory Not Present- Bloody sputum, Chronic Cough, Difficulty Breathing, Snoring and Wheezing. Breast Present- Breast Mass. Not Present- Breast Pain, Nipple Discharge and Skin Changes. Cardiovascular Not Present- Chest Pain, Difficulty Breathing Lying Down, Leg Cramps, Palpitations, Rapid Heart Rate, Shortness of Breath and Swelling of Extremities. Gastrointestinal Present- Abdominal Pain, Indigestion, Nausea and Vomiting. Not Present- Bloating, Bloody Stool, Change in Bowel Habits, Chronic diarrhea, Constipation, Difficulty Swallowing, Excessive gas,  Gets full quickly at meals, Hemorrhoids and Rectal Pain. Female Genitourinary Not Present- Frequency, Nocturia, Painful Urination, Pelvic Pain and Urgency. Musculoskeletal Not Present- Back Pain, Joint Pain, Joint Stiffness,  Muscle Pain, Muscle Weakness and Swelling of Extremities. Neurological Not Present- Decreased Memory, Fainting, Headaches, Numbness, Seizures, Tingling, Tremor, Trouble walking and Weakness. Psychiatric Not Present- Anxiety, Bipolar, Change in Sleep Pattern, Depression, Fearful and Frequent crying. Endocrine Present- Hot flashes. Not Present- Cold Intolerance, Excessive Hunger, Hair Changes, Heat Intolerance and New Diabetes.  Vitals 01/24/2015 10:12 AM Weight: 165.8 lb Height: 65in Body Surface Area: 1.83 m Body Mass Index: 27.59 kg/m  Temp.: 98.64F(Temporal)  Pulse: 77 (Regular)  BP: 126/72 (Sitting, Left Arm, Standard)       Physical Exam General Mental Status-Alert. General Appearance-Consistent with stated age. Hydration-Well hydrated. Voice-Normal.  Head and Neck Head-normocephalic, atraumatic with no lesions or palpable masses. Trachea-midline. Thyroid Gland Characteristics - normal size and consistency.  Eye Eyeball - Bilateral-Extraocular movements intact. Sclera/Conjunctiva - Bilateral-No scleral icterus.  Chest and Lung Exam Chest and lung exam reveals -quiet, even and easy respiratory effort with no use of accessory muscles and on auscultation, normal breath sounds, no adventitious sounds and normal vocal resonance. Inspection Chest Wall - Normal. Back - normal.  Breast - Did not examine.  Cardiovascular Cardiovascular examination reveals -normal heart sounds, regular rate and rhythm with no murmurs and normal pedal pulses bilaterally.  Abdomen Inspection Inspection of the abdomen reveals - No Hernias. Skin - Scar - Note: Old Pfannenstiel incision. Palpation/Percussion Palpation and Percussion of the  abdomen reveal - Soft, Non Tender(Perhaps some subtle tenderness in the epigastric area), No Rebound tenderness, No Rigidity (guarding) and No hepatosplenomegaly. Auscultation Auscultation of the abdomen reveals - Bowel sounds normal.  Peripheral Vascular Upper Extremity Palpation - Pulses bilaterally normal.  Neurologic Neurologic evaluation reveals -alert and oriented x 3 with no impairment of recent or remote memory. Mental Status-Normal.  Neuropsychiatric The patient's mood and affect are described as -normal. Judgment and Insight-insight is appropriate concerning matters relevant to self.  Musculoskeletal Normal Exam - Left-Upper Extremity Strength Normal and Lower Extremity Strength Normal. Normal Exam - Right-Upper Extremity Strength Normal and Lower Extremity Strength Normal.  Lymphatic Head & Neck  General Head & Neck Lymphatics: Bilateral - Description - Normal. Axillary - Did not examine. Femoral & Inguinal - Did not examine.    Assessment & Plan  Alicia Alexander EPIGASTRIC PAIN (R10.13) Impression: I had a long discussion with her and her husband. Some of her symptoms are suggestive of biliary colic with the pain being epigastric radiating to her right side with nausea with some mild discomfort with CCK infusion. However the fact that she states that physical activity will reproduce her symptoms is atypical for biliary etiology. Also the fact that she is having some occasional discomfort with foods is also a little atypical. We discussed several avenues of investigation. I am not entirely convinced that her symptoms are due to her hiatal hernia. I think the best test to start with would be to get a CT scan of her abdomen and pelvis to rule out any other upper abdominal pathology as well as to get a better handle of the size of her hiatal hernia. We discussed the possibility of also needing an upper GI. We also discussed that sometimes we perform cholecystectomy to  eliminate the gallbladder from the differential. We will start with a CT scan. I will call her once we have a CT results and discuss the next step with her over the phone. She and her husband were in complete agreement with the plan Current Plans Follow Up - Call [facility] office after tests / studies done to discuss further  plans Pt Education - [facility] Free Text Education/Instructions: discussed with patient and provided information. NAUSEA (R11.0)  Update- had a long discussion iwth pt on phone regarding essentially normal CT. her hiatal hernia is very tiny so I don't think it is source of RUQ pain. pt still having occasional issues. will get RUQ especially after greasy meal. based on all available data and history, I think she has biliary colic and would benefit partly if not greatly from cholecystectomy. pt agrees and would like to proceed.  We discussed laparoscopic cholecystectomy in detail. We discussed the risks and benefits of a laparoscopic cholecystectomy including, but not limited to bleeding, infection, injury to surrounding structures such as the intestine or liver, bile leak, retained gallstones, need to convert to an open procedure, prolonged diarrhea, blood clots such as DVT, common bile duct injury, anesthesia risks, and possible need for additional procedures. We discussed the typical post-operative recovery course. I explained that the likelihood of improvement of their symptoms is fair to good.  The patient has elected to proceed with surgery.  Leighton Ruff. Redmond Pulling, MD, FACS General, Bariatric, & Minimally Invasive Surgery [facility] Surgery, [place]

## 2015-06-12 NOTE — Anesthesia Preprocedure Evaluation (Signed)
Anesthesia Evaluation  Patient identified by MRN, date of birth, ID band Patient awake    Reviewed: Allergy & Precautions, H&P , NPO status , Patient's Chart, lab work & pertinent test results  Airway Mallampati: II  TM Distance: >3 FB Neck ROM: full    Dental no notable dental hx. (+) Dental Advisory Given, Teeth Intact   Pulmonary neg pulmonary ROS,    Pulmonary exam normal breath sounds clear to auscultation       Cardiovascular Exercise Tolerance: Good hypertension, negative cardio ROS Normal cardiovascular exam Rhythm:regular Rate:Normal  PIH   Neuro/Psych negative neurological ROS  negative psych ROS   GI/Hepatic negative GI ROS, Neg liver ROS,   Endo/Other  negative endocrine ROSdiabetesGestational diabetes. Hyperthyroidism with pregnancy  Renal/GU negative Renal ROS  negative genitourinary   Musculoskeletal   Abdominal   Peds  Hematology negative hematology ROS (+)   Anesthesia Other Findings   Reproductive/Obstetrics negative OB ROS                             Anesthesia Physical Anesthesia Plan  ASA: II  Anesthesia Plan: General   Post-op Pain Management:    Induction: Intravenous  Airway Management Planned: Oral ETT  Additional Equipment:   Intra-op Plan:   Post-operative Plan: Extubation in OR  Informed Consent: I have reviewed the patients History and Physical, chart, labs and discussed the procedure including the risks, benefits and alternatives for the proposed anesthesia with the patient or authorized representative who has indicated his/her understanding and acceptance.   Dental Advisory Given  Plan Discussed with: CRNA and Surgeon  Anesthesia Plan Comments:         Anesthesia Quick Evaluation

## 2015-06-13 ENCOUNTER — Ambulatory Visit (HOSPITAL_COMMUNITY)
Admission: RE | Admit: 2015-06-13 | Discharge: 2015-06-13 | Disposition: A | Discharge status: Home / Self Care | Payer: 59 | Source: Ambulatory Visit | Attending: General Surgery | Admitting: General Surgery

## 2015-06-13 ENCOUNTER — Encounter (HOSPITAL_COMMUNITY)
Admission: RE | Disposition: A | Discharge status: Home / Self Care | Payer: Self-pay | Source: Ambulatory Visit | Attending: General Surgery

## 2015-06-13 ENCOUNTER — Ambulatory Visit (HOSPITAL_COMMUNITY): Payer: 59 | Admitting: Anesthesiology

## 2015-06-13 DIAGNOSIS — K8044 Calculus of bile duct with chronic cholecystitis without obstruction: Secondary | ICD-10-CM | POA: Insufficient documentation

## 2015-06-13 DIAGNOSIS — K811 Chronic cholecystitis: Secondary | ICD-10-CM | POA: Diagnosis not present

## 2015-06-13 DIAGNOSIS — R1011 Right upper quadrant pain: Secondary | ICD-10-CM | POA: Diagnosis not present

## 2015-06-13 DIAGNOSIS — Z79890 Hormone replacement therapy (postmenopausal): Secondary | ICD-10-CM | POA: Insufficient documentation

## 2015-06-13 DIAGNOSIS — K8050 Calculus of bile duct without cholangitis or cholecystitis without obstruction: Secondary | ICD-10-CM | POA: Diagnosis not present

## 2015-06-13 HISTORY — PX: CHOLECYSTECTOMY: SHX55

## 2015-06-13 SURGERY — LAPAROSCOPIC CHOLECYSTECTOMY
Anesthesia: General

## 2015-06-13 MED ORDER — SODIUM CHLORIDE 0.9 % IR SOLN
Status: DC | PRN
  Administered 2015-06-13: 1000 mL

## 2015-06-13 MED ORDER — LIDOCAINE HCL (CARDIAC) 20 MG/ML IV SOLN
INTRAVENOUS | Status: DC | PRN
  Administered 2015-06-13: 80 mg via INTRATRACHEAL

## 2015-06-13 MED ORDER — SCOPOLAMINE 1 MG/3DAYS TD PT72
MEDICATED_PATCH | TRANSDERMAL | Status: AC
  Administered 2015-06-13: 1 via TRANSDERMAL
  Filled 2015-06-13: qty 1

## 2015-06-13 MED ORDER — OXYCODONE HCL 5 MG PO TABS: 5.0000 mg | ORAL_TABLET | ORAL | Status: DC | PRN

## 2015-06-13 MED ORDER — PROMETHAZINE HCL 25 MG/ML IJ SOLN
25.0000 mg | INTRAMUSCULAR | Status: DC
  Filled 2015-06-13: qty 1

## 2015-06-13 MED ORDER — NEOSTIGMINE METHYLSULFATE 10 MG/10ML IV SOLN
INTRAVENOUS | Status: DC | PRN
  Administered 2015-06-13: 3 mg via INTRAVENOUS

## 2015-06-13 MED ORDER — PROPOFOL 10 MG/ML IV BOLUS
INTRAVENOUS | Status: DC | PRN
  Administered 2015-06-13: 160 mg via INTRAVENOUS

## 2015-06-13 MED ORDER — PROMETHAZINE HCL 25 MG/ML IJ SOLN
INTRAMUSCULAR | Status: DC | PRN
  Administered 2015-06-13: 25 mg via INTRAVENOUS

## 2015-06-13 MED ORDER — PROPOFOL 10 MG/ML IV BOLUS
INTRAVENOUS | Status: AC
  Filled 2015-06-13: qty 20

## 2015-06-13 MED ORDER — ROCURONIUM BROMIDE 100 MG/10ML IV SOLN
INTRAVENOUS | Status: DC | PRN
  Administered 2015-06-13: 50 mg via INTRAVENOUS

## 2015-06-13 MED ORDER — BUPIVACAINE-EPINEPHRINE 0.25% -1:200000 IJ SOLN
INTRAMUSCULAR | Status: DC | PRN
  Administered 2015-06-13: 19 mL

## 2015-06-13 MED ORDER — KETOROLAC TROMETHAMINE 30 MG/ML IJ SOLN
INTRAMUSCULAR | Status: DC | PRN
  Administered 2015-06-13: 30 mg via INTRAVENOUS

## 2015-06-13 MED ORDER — MIDAZOLAM HCL 5 MG/5ML IJ SOLN
INTRAMUSCULAR | Status: DC | PRN
  Administered 2015-06-13: 2 mg via INTRAVENOUS

## 2015-06-13 MED ORDER — FENTANYL CITRATE (PF) 250 MCG/5ML IJ SOLN
INTRAMUSCULAR | Status: AC
Start: 2015-06-13 — End: 2015-06-13
  Filled 2015-06-13: qty 5

## 2015-06-13 MED ORDER — LACTATED RINGERS IV SOLN: INTRAVENOUS | Status: DC

## 2015-06-13 MED ORDER — GLYCOPYRROLATE 0.2 MG/ML IJ SOLN
INTRAMUSCULAR | Status: DC | PRN
  Administered 2015-06-13: 0.4 mg via INTRAVENOUS

## 2015-06-13 MED ORDER — LACTATED RINGERS IV SOLN
INTRAVENOUS | Status: DC | PRN
  Administered 2015-06-13 (×2): via INTRAVENOUS

## 2015-06-13 MED ORDER — BUPIVACAINE-EPINEPHRINE (PF) 0.25% -1:200000 IJ SOLN
INTRAMUSCULAR | Status: AC
  Filled 2015-06-13: qty 30

## 2015-06-13 MED ORDER — MIDAZOLAM HCL 2 MG/2ML IJ SOLN
INTRAMUSCULAR | Status: AC
  Filled 2015-06-13: qty 2

## 2015-06-13 MED ORDER — PHENYLEPHRINE HCL 10 MG/ML IJ SOLN
INTRAMUSCULAR | Status: DC | PRN
  Administered 2015-06-13: 80 ug via INTRAVENOUS

## 2015-06-13 MED ORDER — FENTANYL CITRATE (PF) 250 MCG/5ML IJ SOLN
INTRAMUSCULAR | Status: DC | PRN
  Administered 2015-06-13: 100 ug via INTRAVENOUS

## 2015-06-13 MED ORDER — HYDROMORPHONE HCL 1 MG/ML IJ SOLN: 0.2500 mg | INTRAMUSCULAR | Status: DC | PRN

## 2015-06-13 MED FILL — oxyCODONE HCL 5 MG TABS: 5 | 6 days supply | Qty: 40 | Fill #0

## 2015-06-13 SURGICAL SUPPLY — 36 items
APPLIER CLIP 5 13 M/L LIGAMAX5 (MISCELLANEOUS) ×2
BANDAGE ADH SHEER 1  50/CT (GAUZE/BANDAGES/DRESSINGS) ×6 IMPLANT
BENZOIN TINCTURE PRP APPL 2/3 (GAUZE/BANDAGES/DRESSINGS) ×2 IMPLANT
BLADE SURG ROTATE 9660 (MISCELLANEOUS) IMPLANT
CANISTER SUCTION 2500CC (MISCELLANEOUS) ×2 IMPLANT
CHLORAPREP W/TINT 26ML (MISCELLANEOUS) ×2 IMPLANT
CLIP APPLIE 5 13 M/L LIGAMAX5 (MISCELLANEOUS) ×1 IMPLANT
COVER SURGICAL LIGHT HANDLE (MISCELLANEOUS) ×2 IMPLANT
ELECT REM PT RETURN 9FT ADLT (ELECTROSURGICAL) ×2
ELECTRODE REM PT RTRN 9FT ADLT (ELECTROSURGICAL) ×1 IMPLANT
GAUZE SPONGE 2X2 8PLY STRL LF (GAUZE/BANDAGES/DRESSINGS) ×1 IMPLANT
GLOVE BIOGEL M STRL SZ7.5 (GLOVE) ×2 IMPLANT
GLOVE BIOGEL PI IND STRL 8 (GLOVE) ×1 IMPLANT
GLOVE BIOGEL PI INDICATOR 8 (GLOVE) ×1
GOWN STRL REUS W/ TWL LRG LVL3 (GOWN DISPOSABLE) ×2 IMPLANT
GOWN STRL REUS W/ TWL XL LVL3 (GOWN DISPOSABLE) ×1 IMPLANT
GOWN STRL REUS W/TWL LRG LVL3 (GOWN DISPOSABLE) ×2
GOWN STRL REUS W/TWL XL LVL3 (GOWN DISPOSABLE) ×1
KIT BASIN OR (CUSTOM PROCEDURE TRAY) ×2 IMPLANT
KIT ROOM TURNOVER OR (KITS) ×2 IMPLANT
NS IRRIG 1000ML POUR BTL (IV SOLUTION) ×2 IMPLANT
PAD ARMBOARD 7.5X6 YLW CONV (MISCELLANEOUS) ×2 IMPLANT
POUCH RETRIEVAL ECOSAC 10 (ENDOMECHANICALS) ×1 IMPLANT
POUCH RETRIEVAL ECOSAC 10MM (ENDOMECHANICALS) ×1
SCISSORS LAP 5X35 DISP (ENDOMECHANICALS) ×2 IMPLANT
SET IRRIG TUBING LAPAROSCOPIC (IRRIGATION / IRRIGATOR) ×2 IMPLANT
SLEEVE ENDOPATH XCEL 5M (ENDOMECHANICALS) ×4 IMPLANT
SPECIMEN JAR SMALL (MISCELLANEOUS) ×2 IMPLANT
SPONGE GAUZE 2X2 STER 10/PKG (GAUZE/BANDAGES/DRESSINGS) ×1
SUT MNCRL AB 4-0 PS2 18 (SUTURE) ×2 IMPLANT
TOWEL OR 17X24 6PK STRL BLUE (TOWEL DISPOSABLE) ×2 IMPLANT
TOWEL OR 17X26 10 PK STRL BLUE (TOWEL DISPOSABLE) IMPLANT
TRAY LAPAROSCOPIC MC (CUSTOM PROCEDURE TRAY) ×2 IMPLANT
TROCAR XCEL BLUNT TIP 100MML (ENDOMECHANICALS) ×2 IMPLANT
TROCAR XCEL NON-BLD 5MMX100MML (ENDOMECHANICALS) ×2 IMPLANT
TUBING INSUFFLATION (TUBING) ×2 IMPLANT

## 2015-06-13 NOTE — Interval H&P Note (Signed)
History and Physical Interval Note:  06/13/2015 7:13 AM  Alicia Alexander  has presented today for surgery, with the diagnosis of RUQ pain biliary colic   The various methods of treatment have been discussed with the patient and family. After consideration of risks, benefits and other options for treatment, the patient has consented to  Procedure(s): LAPAROSCOPIC CHOLECYSTECTOMY (N/A) as a surgical intervention .  The patient's history has been reviewed, patient examined, no change in status, stable for surgery.  I have reviewed the patient's chart and labs.  Questions were answered to the patient's satisfaction.    Leighton Ruff. Redmond Pulling, MD, [place], Bariatric, & Minimally Invasive Surgery [facility] Surgery, [place]   [facility]

## 2015-06-13 NOTE — Anesthesia Procedure Notes (Signed)
Procedure Name: Intubation Date/Time: 06/13/2015 7:36 AM Performed by: Mariea Clonts Pre-anesthesia Checklist: Emergency Drugs available, Timeout performed, Patient identified, Patient being monitored and Suction available Patient Re-evaluated:Patient Re-evaluated prior to inductionOxygen Delivery Method: Circle system utilized Preoxygenation: Pre-oxygenation with 100% oxygen Intubation Type: IV induction Ventilation: Mask ventilation without difficulty Laryngoscope Size: Miller and 2 Grade View: Grade I Tube type: Oral Tube size: 7.0 mm Number of attempts: 1 Placement Confirmation: ETT inserted through vocal cords under direct vision,  breath sounds checked- equal and bilateral and positive ETCO2 Tube secured with: Tape Dental Injury: Teeth and Oropharynx as per pre-operative assessment

## 2015-06-13 NOTE — Discharge Instructions (Signed)
[facility] °LAPAROSCOPIC SURGERY: POST OP INSTRUCTIONS °Always review your discharge instruction sheet given to you by the facility where your surgery was performed. °IF YOU HAVE DISABILITY OR FAMILY LEAVE FORMS, YOU MUST BRING THEM TO THE OFFICE FOR PROCESSING.   °DO NOT GIVE THEM TO YOUR DOCTOR. ° °1. A prescription for pain medication may be given to you upon discharge.  Take your pain medication as prescribed, if needed.  If narcotic pain medicine is not needed, then you may take acetaminophen (Tylenol) &/ or ibuprofen (Advil) as needed. °2. Take your usually prescribed medications unless otherwise directed. °3. If you need a refill on your pain medication, please contact your pharmacy.  They will contact our office to request authorization. Prescriptions will not be filled after 5pm or on week-ends. °4. You should follow a light diet the first few days after arrival home, such as soup and crackers, etc.  Be sure to include lots of fluids daily. °5. Most patients will experience some swelling and bruising in the area of the incisions.  Ice packs will help.  Swelling and bruising can take several days to resolve.  °6. It is common to experience some constipation if taking pain medication after surgery.  Increasing fluid intake and taking a stool softener (such as Colace) will usually help or prevent this problem from occurring.  A mild laxative (Milk of Magnesia or Miralax) should be taken according to package instructions if there are no bowel movements after 48 hours. °7. Unless discharge instructions indicate otherwise, you may remove your bandages 48 hours after surgery, and you may shower at that time.  You  have steri-strips (small skin tapes) in place directly over the incision.  These strips should be left on the skin for 7-10 days.  °8. ACTIVITIES:  You may resume regular (light) daily activities beginning the next day--such as daily self-care, walking, climbing stairs--gradually  increasing activities as tolerated.  You may have sexual intercourse when it is comfortable.  Refrain from any heavy lifting or straining until approved by your doctor. °a. You may drive when you are no longer taking prescription pain medication, you can comfortably wear a seatbelt, and you can safely maneuver your car and apply brakes. °9. You should see your doctor in the office for a follow-up appointment approximately 2-3 weeks after your surgery.  Make sure that you call for this appointment within a day or two after you arrive home to insure a convenient appointment time. °10. OTHER INSTRUCTIONS:  °WHEN TO CALL YOUR DOCTOR: °1. Fever over 101.0 °2. Inability to urinate °3. Continued bleeding from incision. °4. Increased pain, redness, or drainage from the incision. °5. Increasing abdominal pain ° °The clinic staff is available to answer your questions during regular business hours.  Please don’t hesitate to call and ask to speak to one of the nurses for clinical concerns.  If you have a medical emergency, go to the nearest emergency room or call 911.  A surgeon from [facility] is always on call at the hospital. °[address], [address], [place], [place]  [address] ? [address] [address], [place], [place]   [address] °[phone] ? [phone] ? [phone] [phone] °Web site: [email or web address] ° ° ° ° ° °

## 2015-06-13 NOTE — Anesthesia Postprocedure Evaluation (Signed)
Anesthesia Post Note  Patient: Alicia Alexander  Procedure(s) Performed: Procedure(s) (LRB): LAPAROSCOPIC CHOLECYSTECTOMY (N/A)  Patient location during evaluation: PACU Anesthesia Type: General Level of consciousness: awake and alert Pain management: pain level controlled Vital Signs Assessment: post-procedure vital signs reviewed and stable Respiratory status: spontaneous breathing, nonlabored ventilation, respiratory function stable and patient connected to nasal cannula oxygen Cardiovascular status: blood pressure returned to baseline and stable Postop Assessment: no signs of nausea or vomiting Anesthetic complications: no    Last Vitals:  Filed Vitals:   06/13/15 0915 06/13/15 0930  BP: 131/84 144/95  Pulse: 58 71  Temp:    Resp: 18 14    Last Pain:  Filed Vitals:   06/13/15 0939  PainSc: 2                  Mavric Cortright L

## 2015-06-13 NOTE — Transfer of Care (Signed)
Immediate Anesthesia Transfer of Care Note  Patient: Alicia Alexander  Procedure(s) Performed: Procedure(s): LAPAROSCOPIC CHOLECYSTECTOMY (N/A)  Patient Location: PACU  Anesthesia Type:General  Level of Consciousness: awake, alert  and oriented  Airway & Oxygen Therapy: Patient Spontanous Breathing and Patient connected to nasal cannula oxygen  Post-op Assessment: Report given to RN and Post -op Vital signs reviewed and stable  Post vital signs: Reviewed and stable  Last Vitals:  Filed Vitals:   06/13/15 0843 06/13/15 0844  BP:  158/87  Pulse: 72 69  Temp: 36.6 C   Resp: 27 14    Last Pain:  Filed Vitals:   06/13/15 0846  PainSc: 1          Complications: No apparent anesthesia complications

## 2015-06-13 NOTE — Op Note (Signed)
Alicia Alexander [id number] 01/24/1975 06/13/2015  Laparoscopic Cholecystectomy  Procedure Note  Indications: This patient presents with chronic right upper quadrant pain and will undergo laparoscopic cholecystectomy. Please see h& p for additional details.   Pre-operative Diagnosis: biliary colic  Post-operative Diagnosis: Same  Surgeon: Gayland Curry   Assistants: none  Anesthesia: General endotracheal anesthesia  Procedure Details  The patient was seen again in the Holding Room. The risks, benefits, complications, treatment options, and expected outcomes were discussed with the patient. The possibilities of reaction to medication, pulmonary aspiration, perforation of viscus, bleeding, recurrent infection, finding a normal gallbladder, the need for additional procedures, failure to diagnose a condition, the possible need to convert to an open procedure, and creating a complication requiring transfusion or operation were discussed with the patient. The likelihood of improving the patient's symptoms with return to their baseline status is good.  The patient and/or family concurred with the proposed plan, giving informed consent. The site of surgery properly noted. The patient was taken to Operating Room, identified as Alicia Alexander and the procedure verified as Laparoscopic Cholecystectomy. A Time Out was held and the above information confirmed. Antibiotic prophylaxis was administered.   Prior to the induction of general anesthesia, antibiotic prophylaxis was administered. General endotracheal anesthesia was then administered and tolerated well. After the induction, the abdomen was prepped with Chloraprep and draped in the sterile fashion. The patient was positioned in the supine position.  Local anesthetic agent was injected into the skin near the umbilicus and an incision made. We dissected down to the abdominal fascia with blunt dissection.  The fascia was incised vertically and we  entered the peritoneal cavity bluntly.  A pursestring suture of 0-Vicryl was placed around the fascial opening.  The Hasson cannula was inserted and secured with the stay suture.  Pneumoperitoneum was then created with CO2 and tolerated well without any adverse changes in the patient's vital signs. An 5-mm port was placed in the subxiphoid position.  Two 5-mm ports were placed in the right upper quadrant. All skin incisions were infiltrated with a local anesthetic agent before making the incision and placing the trocars.   We positioned the patient in reverse Trendelenburg, tilted slightly to the patient's left.  The gallbladder was identified, the fundus grasped and retracted cephalad. Adhesions were lysed bluntly and with the electrocautery where indicated, taking care not to injure any adjacent organs or viscus. The infundibulum was grasped and retracted laterally, exposing the peritoneum overlying the triangle of Calot. This was then divided and exposed in a blunt fashion. A critical view of the cystic duct and cystic artery was obtained.  The cystic duct was clearly identified and bluntly dissected circumferentially.   The cystic duct was then ligated with clips and divided. The cystic artery which had been identified & dissected free was ligated with clips and divided as well.   The gallbladder was dissected from the liver bed in retrograde fashion with the electrocautery. The gallbladder was removed and placed in an Ecco sac.  The gallbladder and Ecco sac were then removed through the umbilical port site. The liver bed was irrigated and inspected. Hemostasis was achieved with the electrocautery. Copious irrigation was utilized and was repeatedly aspirated until clear.  The pursestring suture was used to close the umbilical fascia.    We again inspected the right upper quadrant for hemostasis.  The umbilical closure was inspected and there was no air leak and nothing trapped within the closure.  Pneumoperitoneum was released  as we removed the trocars.  4-0 Monocryl was used to close the skin.   Benzoin, steri-strips, and clean dressings were applied. The patient was then extubated and brought to the recovery room in stable condition. Instrument, sponge, and needle counts were correct at closure and at the conclusion of the case.   Findings: +critical view; normal appearing liver  Estimated Blood Loss: Minimal         Drains: none         Specimens: Gallbladder           Complications: None; patient tolerated the procedure well.         Disposition: PACU - hemodynamically stable.         Condition: stable  Leighton Ruff. Redmond Pulling, MD, FACS General, Bariatric, & Minimally Invasive Surgery [facility], [place]

## 2015-06-14 ENCOUNTER — Encounter (HOSPITAL_COMMUNITY): Payer: Self-pay | Admitting: General Surgery

## 2015-06-18 MED FILL — OSCIMIN 0.125 MG ODT: 0.125 | 7 days supply | Qty: 30 | Fill #0

## 2015-07-13 HISTORY — PX: BREAST SURGERY: SHX581

## 2015-07-15 MED FILL — PREMARIN VAGINAL CREAM-APPL: 0.625 | 90 days supply | Qty: 30 | Fill #0

## 2015-07-15 MED FILL — ADAPALENE 0.3% GEL: 0.3 | 30 days supply | Qty: 45 | Fill #0

## 2015-07-29 ENCOUNTER — Other Ambulatory Visit: Payer: Self-pay | Admitting: Obstetrics and Gynecology

## 2015-07-29 DIAGNOSIS — N6310 Unspecified lump in the right breast, unspecified quadrant: Secondary | ICD-10-CM

## 2015-08-01 ENCOUNTER — Other Ambulatory Visit: Payer: Self-pay | Admitting: Obstetrics and Gynecology

## 2015-08-01 ENCOUNTER — Ambulatory Visit
Admission: RE | Admit: 2015-08-01 | Discharge: 2015-08-01 | Disposition: A | Discharge status: Home / Self Care | Payer: 59 | Source: Ambulatory Visit | Attending: Obstetrics and Gynecology | Admitting: Obstetrics and Gynecology

## 2015-08-01 DIAGNOSIS — N63 Unspecified lump in breast: Secondary | ICD-10-CM | POA: Diagnosis not present

## 2015-08-01 DIAGNOSIS — N6310 Unspecified lump in the right breast, unspecified quadrant: Secondary | ICD-10-CM

## 2015-08-05 ENCOUNTER — Other Ambulatory Visit: Payer: Self-pay | Admitting: Obstetrics and Gynecology

## 2015-08-05 DIAGNOSIS — N6310 Unspecified lump in the right breast, unspecified quadrant: Secondary | ICD-10-CM

## 2015-08-06 ENCOUNTER — Ambulatory Visit
Admission: RE | Admit: 2015-08-06 | Discharge: 2015-08-06 | Disposition: A | Discharge status: Home / Self Care | Payer: 59 | Source: Ambulatory Visit | Attending: Obstetrics and Gynecology | Admitting: Obstetrics and Gynecology

## 2015-08-06 DIAGNOSIS — N6310 Unspecified lump in the right breast, unspecified quadrant: Secondary | ICD-10-CM

## 2015-08-06 DIAGNOSIS — D241 Benign neoplasm of right breast: Secondary | ICD-10-CM | POA: Diagnosis not present

## 2015-08-06 DIAGNOSIS — N62 Hypertrophy of breast: Secondary | ICD-10-CM | POA: Diagnosis not present

## 2015-08-06 DIAGNOSIS — N63 Unspecified lump in breast: Secondary | ICD-10-CM | POA: Diagnosis not present

## 2015-08-07 ENCOUNTER — Other Ambulatory Visit: Payer: Self-pay | Admitting: Obstetrics and Gynecology

## 2015-08-07 DIAGNOSIS — R921 Mammographic calcification found on diagnostic imaging of breast: Secondary | ICD-10-CM

## 2015-08-07 NOTE — Progress Notes (Signed)
New diagnosis of right breast atypia on biopsy.  Will stop Premarin vaginal cream.

## 2015-08-08 ENCOUNTER — Other Ambulatory Visit: Payer: Self-pay | Admitting: Obstetrics and Gynecology

## 2015-08-08 ENCOUNTER — Ambulatory Visit
Admission: RE | Admit: 2015-08-08 | Discharge: 2015-08-08 | Disposition: A | Discharge status: Home / Self Care | Payer: 59 | Source: Ambulatory Visit | Attending: Obstetrics and Gynecology | Admitting: Obstetrics and Gynecology

## 2015-08-08 DIAGNOSIS — R921 Mammographic calcification found on diagnostic imaging of breast: Secondary | ICD-10-CM

## 2015-08-08 DIAGNOSIS — N63 Unspecified lump in breast: Secondary | ICD-10-CM | POA: Diagnosis not present

## 2015-08-08 DIAGNOSIS — N6011 Diffuse cystic mastopathy of right breast: Secondary | ICD-10-CM | POA: Diagnosis not present

## 2015-08-22 ENCOUNTER — Encounter: Payer: Self-pay | Admitting: Obstetrics and Gynecology

## 2015-08-22 ENCOUNTER — Ambulatory Visit (INDEPENDENT_AMBULATORY_CARE_PROVIDER_SITE_OTHER): Payer: 59 | Admitting: Obstetrics and Gynecology

## 2015-08-22 VITALS — BP 142/82 | HR 80 | Ht 65.0 in | Wt 167.0 lb

## 2015-08-22 DIAGNOSIS — N62 Hypertrophy of breast: Secondary | ICD-10-CM

## 2015-08-22 DIAGNOSIS — N644 Mastodynia: Secondary | ICD-10-CM

## 2015-08-22 DIAGNOSIS — N6091 Unspecified benign mammary dysplasia of right breast: Secondary | ICD-10-CM

## 2015-08-22 NOTE — Progress Notes (Signed)
GYNECOLOGY  VISIT   HPI: 40 y.o.   Married  Caucasian  female   [phone] with No LMP recorded. Patient has had a hysterectomy.   here for right breast pain.  Status post breast biopsy x 3.   Dx is atypical lobular hyperplasia.  Has general surgery consultation tomorrow with Dr. Excell Seltzer.   Last bx was 2 weeks ago.  No fever, shakes, chills or feeling fluish.  No drainage since a couple of days following biopsies.   Maternal aunt with breast cancer.   GYNECOLOGIC HISTORY: No LMP recorded. Patient has had a hysterectomy. Contraception:  Hysterectomy Menopausal hormone therapy:  none Last mammogram:  07/2015--see Epic Last pap smear:   05/2008 normal        OB History    Gravida Para Term Preterm AB Living   3 2 2   1 2    SAB TAB Ectopic Multiple Live Births   1                 There are no active problems to display for this patient.   Past Medical History:  Diagnosis Date  . Abnormal Pap smear of cervix 2001   --ascus  . Anemia   . Blood transfusion without reported diagnosis 01-31-00  . Gestational diabetes   . Headache    HX MIGRAINES   . Hyperthyroidism    W/ PREGNANCY  . Pregnancy induced hypertension   . Thyroid disease    only with pregnancy    Past Surgical History:  Procedure Laterality Date  . ABDOMINAL HYSTERECTOMY  10-11-07   TVH--ovaries remain     (LAPAROSCOPIC)  . BREAST SURGERY  07/2015   3 benign breast biopsies.  Atypical hyperplasia.  Marland Kitchen CESAREAN SECTION  01/2000, 05/2005  . CHOLECYSTECTOMY N/A 06/13/2015   Procedure: LAPAROSCOPIC CHOLECYSTECTOMY;  Surgeon: Greer Pickerel, MD;  Location: [place];  Service: General;  Laterality: N/A;  . COLPOSCOPY  2001  . WISDOM TOOTH EXTRACTION  1995    Current Outpatient Prescriptions  Medication Sig Dispense Refill  . adapalene (DIFFERIN) 0.1 % gel Apply 1 application topically daily.    Marland Kitchen loratadine (CLARITIN) 10 MG tablet Take 10 mg by mouth daily as needed for allergies or rhinitis.     Marland Kitchen oxyCODONE (OXY  IR/ROXICODONE) 5 MG immediate release tablet Take 1 tablet (5 mg total) by mouth every 4 (four) hours as needed for moderate pain, severe pain or breakthrough pain. (Patient not taking: Reported on 08/22/2015) 40 tablet 0   No current facility-administered medications for this visit.      ALLERGIES: Zofran [ondansetron hcl]  Family History  Problem Relation Age of Onset  . COPD Mother   . Emphysema Mother   . Thyroid disease Mother     hypothyroid  . Atrial fibrillation Mother   . Osteopenia Mother   . Cancer Paternal Grandfather     dec--pancreatic ca  . Diabetes Father     AODM  . Heart attack Father   . Breast cancer Maternal Aunt   . Lupus Maternal Aunt     Social History   Social History  . Marital status: Married    Spouse name: N/A  . Number of children: N/A  . Years of education: N/A   Occupational History  . Not on file.   Social History Main Topics  . Smoking status: Never Smoker  . Smokeless tobacco: Never Used  . Alcohol use 0.0 oz/week     Comment: occ.  . Drug use:  No  . Sexual activity: Yes    Partners: Male    Birth control/ protection: Surgical     Comment: TVH 2009   Other Topics Concern  . Not on file   Social History Narrative  . No narrative on file    ROS:  Pertinent items are noted in HPI.  PHYSICAL EXAMINATION:    BP (!) 142/82 (BP Location: Right Arm, Patient Position: Sitting, Cuff Size: Normal)   Pulse 80   Ht 5\' 5"  (1.651 m)   Wt 167 lb (75.8 kg)   BMI 27.79 kg/m     General appearance: alert, cooperative and appears stated age   Breasts: minor ecchymoses at 12:00 and 7:00.  No dominant masses.  Mild tenderness at 10:00.  No nipple retraction or dimpling, No nipple discharge or bleeding, No axillary or supraclavicular adenopathy    Chaperone was present for exam.  ASSESSMENT  Atypical lobular hyperplasia of right breast.  Breast pain following biopsies.  No evidence of mastitis. FH of breast cancer.    PLAN  Discussion of lobular hyperplasia and flat atypia.  Up To Date information given on benign breast conditions.  Discussed Tamoxifen treatment for breast cancer prevention.  I suggested the high risk clinic at the [facility] and potential genetic screening.  Follow up with general surgeon tomorrow.    An After Visit Summary was printed and given to the patient.  __15____ minutes face to face time of which over 50% was spent in counseling.

## 2015-08-23 ENCOUNTER — Other Ambulatory Visit: Payer: Self-pay | Admitting: General Surgery

## 2015-08-23 ENCOUNTER — Ambulatory Visit: Payer: BLUE CROSS/BLUE SHIELD | Admitting: Obstetrics and Gynecology

## 2015-08-23 DIAGNOSIS — R928 Other abnormal and inconclusive findings on diagnostic imaging of breast: Secondary | ICD-10-CM

## 2015-08-23 DIAGNOSIS — N6091 Unspecified benign mammary dysplasia of right breast: Secondary | ICD-10-CM

## 2015-08-29 ENCOUNTER — Other Ambulatory Visit: Payer: Self-pay | Admitting: General Surgery

## 2015-08-29 DIAGNOSIS — N6091 Unspecified benign mammary dysplasia of right breast: Secondary | ICD-10-CM

## 2015-10-01 ENCOUNTER — Encounter (HOSPITAL_BASED_OUTPATIENT_CLINIC_OR_DEPARTMENT_OTHER): Payer: Self-pay | Admitting: *Deleted

## 2015-10-07 ENCOUNTER — Ambulatory Visit
Admission: RE | Admit: 2015-10-07 | Discharge: 2015-10-07 | Disposition: A | Discharge status: Home / Self Care | Payer: 59 | Source: Ambulatory Visit | Attending: General Surgery | Admitting: General Surgery

## 2015-10-07 DIAGNOSIS — N6089 Other benign mammary dysplasias of unspecified breast: Secondary | ICD-10-CM | POA: Diagnosis not present

## 2015-10-07 DIAGNOSIS — R928 Other abnormal and inconclusive findings on diagnostic imaging of breast: Secondary | ICD-10-CM

## 2015-10-07 DIAGNOSIS — N6091 Unspecified benign mammary dysplasia of right breast: Secondary | ICD-10-CM

## 2015-10-07 NOTE — Progress Notes (Signed)
Pt given a 8 oz carton of boost breeze with written and verbal instructions to drink by615 am morning of surgery teach back , pt voiced understanding,

## 2015-10-08 ENCOUNTER — Ambulatory Visit: Payer: Self-pay | Admitting: General Surgery

## 2015-10-08 DIAGNOSIS — R928 Other abnormal and inconclusive findings on diagnostic imaging of breast: Secondary | ICD-10-CM

## 2015-10-09 ENCOUNTER — Ambulatory Visit (HOSPITAL_BASED_OUTPATIENT_CLINIC_OR_DEPARTMENT_OTHER): Payer: 59 | Admitting: Anesthesiology

## 2015-10-09 ENCOUNTER — Encounter (HOSPITAL_BASED_OUTPATIENT_CLINIC_OR_DEPARTMENT_OTHER)
Admission: RE | Disposition: A | Discharge status: Home / Self Care | Payer: Self-pay | Source: Ambulatory Visit | Attending: General Surgery

## 2015-10-09 ENCOUNTER — Ambulatory Visit
Admission: RE | Admit: 2015-10-09 | Discharge: 2015-10-09 | Disposition: A | Discharge status: Home / Self Care | Payer: 59 | Source: Ambulatory Visit | Attending: General Surgery | Admitting: General Surgery

## 2015-10-09 ENCOUNTER — Ambulatory Visit (HOSPITAL_BASED_OUTPATIENT_CLINIC_OR_DEPARTMENT_OTHER)
Admission: RE | Admit: 2015-10-09 | Discharge: 2015-10-09 | Disposition: A | Discharge status: Home / Self Care | Payer: 59 | Source: Ambulatory Visit | Attending: General Surgery | Admitting: General Surgery

## 2015-10-09 ENCOUNTER — Encounter (HOSPITAL_BASED_OUTPATIENT_CLINIC_OR_DEPARTMENT_OTHER): Payer: Self-pay | Admitting: Anesthesiology

## 2015-10-09 DIAGNOSIS — N6011 Diffuse cystic mastopathy of right breast: Secondary | ICD-10-CM | POA: Diagnosis not present

## 2015-10-09 DIAGNOSIS — D649 Anemia, unspecified: Secondary | ICD-10-CM | POA: Insufficient documentation

## 2015-10-09 DIAGNOSIS — N62 Hypertrophy of breast: Secondary | ICD-10-CM | POA: Diagnosis not present

## 2015-10-09 DIAGNOSIS — N6021 Fibroadenosis of right breast: Secondary | ICD-10-CM | POA: Diagnosis not present

## 2015-10-09 DIAGNOSIS — Z8632 Personal history of gestational diabetes: Secondary | ICD-10-CM | POA: Insufficient documentation

## 2015-10-09 DIAGNOSIS — Z90710 Acquired absence of both cervix and uterus: Secondary | ICD-10-CM | POA: Insufficient documentation

## 2015-10-09 DIAGNOSIS — N6091 Unspecified benign mammary dysplasia of right breast: Secondary | ICD-10-CM

## 2015-10-09 DIAGNOSIS — Z9049 Acquired absence of other specified parts of digestive tract: Secondary | ICD-10-CM | POA: Insufficient documentation

## 2015-10-09 DIAGNOSIS — R921 Mammographic calcification found on diagnostic imaging of breast: Secondary | ICD-10-CM | POA: Diagnosis not present

## 2015-10-09 DIAGNOSIS — I10 Essential (primary) hypertension: Secondary | ICD-10-CM | POA: Insufficient documentation

## 2015-10-09 DIAGNOSIS — Z803 Family history of malignant neoplasm of breast: Secondary | ICD-10-CM | POA: Diagnosis not present

## 2015-10-09 DIAGNOSIS — Z888 Allergy status to other drugs, medicaments and biological substances status: Secondary | ICD-10-CM | POA: Insufficient documentation

## 2015-10-09 DIAGNOSIS — R928 Other abnormal and inconclusive findings on diagnostic imaging of breast: Secondary | ICD-10-CM

## 2015-10-09 HISTORY — PX: BREAST LUMPECTOMY WITH RADIOACTIVE SEED LOCALIZATION: SHX6424

## 2015-10-09 SURGERY — BREAST LUMPECTOMY WITH RADIOACTIVE SEED LOCALIZATION
Anesthesia: General | Site: Breast | Laterality: Right

## 2015-10-09 MED ORDER — ONDANSETRON HCL 4 MG/2ML IJ SOLN
INTRAMUSCULAR | Status: AC
  Filled 2015-10-09: qty 2

## 2015-10-09 MED ORDER — PROPOFOL 10 MG/ML IV BOLUS
INTRAVENOUS | Status: DC | PRN
  Administered 2015-10-09: 200 mg via INTRAVENOUS

## 2015-10-09 MED ORDER — FENTANYL CITRATE (PF) 100 MCG/2ML IJ SOLN
INTRAMUSCULAR | Status: DC | PRN
  Administered 2015-10-09: 100 ug via INTRAVENOUS
  Administered 2015-10-09 (×3): 50 ug via INTRAVENOUS

## 2015-10-09 MED ORDER — GABAPENTIN 300 MG PO CAPS
300.0000 mg | ORAL_CAPSULE | ORAL | Status: AC
  Administered 2015-10-09: 300 mg via ORAL

## 2015-10-09 MED ORDER — CEFAZOLIN SODIUM-DEXTROSE 2-4 GM/100ML-% IV SOLN
2.0000 g | INTRAVENOUS | Status: AC
  Administered 2015-10-09: 2 g via INTRAVENOUS

## 2015-10-09 MED ORDER — CHLORHEXIDINE GLUCONATE CLOTH 2 % EX PADS: 6.0000 | MEDICATED_PAD | Freq: Once | CUTANEOUS | Status: DC

## 2015-10-09 MED ORDER — DEXAMETHASONE SODIUM PHOSPHATE 10 MG/ML IJ SOLN
INTRAMUSCULAR | Status: AC
  Filled 2015-10-09: qty 1

## 2015-10-09 MED ORDER — CEFAZOLIN SODIUM-DEXTROSE 2-4 GM/100ML-% IV SOLN
INTRAVENOUS | Status: AC
  Filled 2015-10-09: qty 100

## 2015-10-09 MED ORDER — MIDAZOLAM HCL 5 MG/5ML IJ SOLN
INTRAMUSCULAR | Status: DC | PRN
  Administered 2015-10-09: 2 mg via INTRAVENOUS

## 2015-10-09 MED ORDER — GABAPENTIN 300 MG PO CAPS
ORAL_CAPSULE | ORAL | Status: AC
  Filled 2015-10-09: qty 1

## 2015-10-09 MED ORDER — HYDROMORPHONE HCL 1 MG/ML IJ SOLN
0.2500 mg | INTRAMUSCULAR | Status: DC | PRN
  Administered 2015-10-09: 0.5 mg via INTRAVENOUS

## 2015-10-09 MED ORDER — FENTANYL CITRATE (PF) 100 MCG/2ML IJ SOLN
INTRAMUSCULAR | Status: AC
  Filled 2015-10-09: qty 2

## 2015-10-09 MED ORDER — LIDOCAINE HCL (CARDIAC) 20 MG/ML IV SOLN
INTRAVENOUS | Status: DC | PRN
  Administered 2015-10-09: 30 mg via INTRAVENOUS
  Administered 2015-10-09: 50 mg via INTRAVENOUS

## 2015-10-09 MED ORDER — CELECOXIB 400 MG PO CAPS
400.0000 mg | ORAL_CAPSULE | ORAL | Status: AC
  Administered 2015-10-09: 400 mg via ORAL

## 2015-10-09 MED ORDER — BUPIVACAINE-EPINEPHRINE (PF) 0.25% -1:200000 IJ SOLN
INTRAMUSCULAR | Status: AC
  Filled 2015-10-09: qty 60

## 2015-10-09 MED ORDER — KETOROLAC TROMETHAMINE 30 MG/ML IJ SOLN
INTRAMUSCULAR | Status: DC | PRN
  Administered 2015-10-09: 30 mg via INTRAVENOUS

## 2015-10-09 MED ORDER — HYDROMORPHONE HCL 1 MG/ML IJ SOLN
INTRAMUSCULAR | Status: AC
  Filled 2015-10-09: qty 1

## 2015-10-09 MED ORDER — FENTANYL CITRATE (PF) 100 MCG/2ML IJ SOLN: 50.0000 ug | INTRAMUSCULAR | Status: DC | PRN

## 2015-10-09 MED ORDER — HYDROCODONE-ACETAMINOPHEN 5-325 MG PO TABS: 1.0000 | ORAL_TABLET | ORAL | 0 refills | Status: DC | PRN

## 2015-10-09 MED ORDER — LIDOCAINE 2% (20 MG/ML) 5 ML SYRINGE
INTRAMUSCULAR | Status: AC
  Filled 2015-10-09: qty 5

## 2015-10-09 MED ORDER — SCOPOLAMINE 1 MG/3DAYS TD PT72
1.0000 | MEDICATED_PATCH | Freq: Once | TRANSDERMAL | Status: DC | PRN
  Administered 2015-10-09: 1.5 mg via TRANSDERMAL

## 2015-10-09 MED ORDER — ACETAMINOPHEN 500 MG PO TABS
ORAL_TABLET | ORAL | Status: AC
  Filled 2015-10-09: qty 2

## 2015-10-09 MED ORDER — SCOPOLAMINE 1 MG/3DAYS TD PT72
MEDICATED_PATCH | TRANSDERMAL | Status: AC
  Filled 2015-10-09: qty 1

## 2015-10-09 MED ORDER — CELECOXIB 200 MG PO CAPS
ORAL_CAPSULE | ORAL | Status: AC
  Filled 2015-10-09: qty 2

## 2015-10-09 MED ORDER — ACETAMINOPHEN 500 MG PO TABS
1000.0000 mg | ORAL_TABLET | ORAL | Status: AC
  Administered 2015-10-09: 1000 mg via ORAL

## 2015-10-09 MED ORDER — BUPIVACAINE-EPINEPHRINE (PF) 0.25% -1:200000 IJ SOLN
INTRAMUSCULAR | Status: DC | PRN
  Administered 2015-10-09: 29 mL via PERINEURAL

## 2015-10-09 MED ORDER — DEXAMETHASONE SODIUM PHOSPHATE 4 MG/ML IJ SOLN
INTRAMUSCULAR | Status: DC | PRN
  Administered 2015-10-09: 10 mg via INTRAVENOUS

## 2015-10-09 MED ORDER — PROPOFOL 10 MG/ML IV BOLUS
INTRAVENOUS | Status: AC
  Filled 2015-10-09: qty 20

## 2015-10-09 MED ORDER — GLYCOPYRROLATE 0.2 MG/ML IJ SOLN: 0.2000 mg | Freq: Once | INTRAMUSCULAR | Status: DC | PRN

## 2015-10-09 MED ORDER — LACTATED RINGERS IV SOLN
INTRAVENOUS | Status: DC
  Administered 2015-10-09 (×2): via INTRAVENOUS

## 2015-10-09 MED ORDER — MIDAZOLAM HCL 2 MG/2ML IJ SOLN: 1.0000 mg | INTRAMUSCULAR | Status: DC | PRN

## 2015-10-09 MED ORDER — MIDAZOLAM HCL 2 MG/2ML IJ SOLN
INTRAMUSCULAR | Status: AC
  Filled 2015-10-09: qty 2

## 2015-10-09 SURGICAL SUPPLY — 47 items
BINDER BREAST LRG (GAUZE/BANDAGES/DRESSINGS) IMPLANT
BINDER BREAST MEDIUM (GAUZE/BANDAGES/DRESSINGS) IMPLANT
BINDER BREAST XLRG (GAUZE/BANDAGES/DRESSINGS) IMPLANT
BINDER BREAST XXLRG (GAUZE/BANDAGES/DRESSINGS) IMPLANT
BLADE SURG 15 STRL LF DISP TIS (BLADE) ×1 IMPLANT
BLADE SURG 15 STRL SS (BLADE) ×1
CANISTER SUC SOCK COL 7IN (MISCELLANEOUS) IMPLANT
CANISTER SUCT 1200ML W/VALVE (MISCELLANEOUS) IMPLANT
CHLORAPREP W/TINT 26ML (MISCELLANEOUS) ×2 IMPLANT
CLIP TI WIDE RED SMALL 6 (CLIP) IMPLANT
COVER BACK TABLE 60X90IN (DRAPES) ×2 IMPLANT
COVER MAYO STAND STRL (DRAPES) ×2 IMPLANT
COVER PROBE W GEL 5X96 (DRAPES) ×2 IMPLANT
DECANTER SPIKE VIAL GLASS SM (MISCELLANEOUS) IMPLANT
DERMABOND ADVANCED (GAUZE/BANDAGES/DRESSINGS) ×1
DERMABOND ADVANCED .7 DNX12 (GAUZE/BANDAGES/DRESSINGS) ×1 IMPLANT
DEVICE DUBIN W/COMP PLATE 8390 (MISCELLANEOUS) ×4 IMPLANT
DRAPE LAPAROSCOPIC ABDOMINAL (DRAPES) ×2 IMPLANT
DRAPE UTILITY XL STRL (DRAPES) ×2 IMPLANT
ELECT COATED BLADE 2.86 ST (ELECTRODE) ×2 IMPLANT
ELECT REM PT RETURN 9FT ADLT (ELECTROSURGICAL) ×2
ELECTRODE REM PT RTRN 9FT ADLT (ELECTROSURGICAL) ×1 IMPLANT
GLOVE BIO SURGEON STRL SZ 6.5 (GLOVE) ×2 IMPLANT
GLOVE BIOGEL PI IND STRL 6.5 (GLOVE) ×2 IMPLANT
GLOVE BIOGEL PI IND STRL 8 (GLOVE) ×1 IMPLANT
GLOVE BIOGEL PI INDICATOR 6.5 (GLOVE) ×2
GLOVE BIOGEL PI INDICATOR 8 (GLOVE) ×1
GLOVE ECLIPSE 7.5 STRL STRAW (GLOVE) ×2 IMPLANT
GOWN STRL REUS W/ TWL LRG LVL3 (GOWN DISPOSABLE) ×1 IMPLANT
GOWN STRL REUS W/ TWL XL LVL3 (GOWN DISPOSABLE) ×1 IMPLANT
GOWN STRL REUS W/TWL LRG LVL3 (GOWN DISPOSABLE) ×1
GOWN STRL REUS W/TWL XL LVL3 (GOWN DISPOSABLE) ×1
ILLUMINATOR WAVEGUIDE N/F (MISCELLANEOUS) IMPLANT
KIT MARKER MARGIN INK (KITS) ×2 IMPLANT
NEEDLE HYPO 25X1 1.5 SAFETY (NEEDLE) ×2 IMPLANT
NS IRRIG 1000ML POUR BTL (IV SOLUTION) IMPLANT
PACK BASIN DAY SURGERY FS (CUSTOM PROCEDURE TRAY) ×2 IMPLANT
PENCIL BUTTON HOLSTER BLD 10FT (ELECTRODE) ×2 IMPLANT
SLEEVE SCD COMPRESS KNEE MED (MISCELLANEOUS) ×2 IMPLANT
SPONGE LAP 4X18 X RAY DECT (DISPOSABLE) ×2 IMPLANT
SUT MON AB 5-0 PS2 18 (SUTURE) ×2 IMPLANT
SUT VICRYL 3-0 CR8 SH (SUTURE) ×2 IMPLANT
SYR CONTROL 10ML LL (SYRINGE) ×2 IMPLANT
TOWEL OR 17X24 6PK STRL BLUE (TOWEL DISPOSABLE) ×2 IMPLANT
TOWEL OR NON WOVEN STRL DISP B (DISPOSABLE) ×2 IMPLANT
TUBE CONNECTING 20X1/4 (TUBING) IMPLANT
YANKAUER SUCT BULB TIP NO VENT (SUCTIONS) IMPLANT

## 2015-10-09 NOTE — H&P (Signed)
History of Present Illness  The patient is a 40 year old female who presents with a complaint of Breast problems. She is referred by Dr. Lajean Manes for an abnormal mammogram in recent core biopsy showing epithelial atypia and atypical lobular hyperplasia. She is a 40 year old female with no previous history of breast disease. She does have a family history of a maternal aunt with breast cancer. She recently presented for short-term imaging follow-up after previous mammogram had shown a circumscribed probable benign appearing mass in the right breast. This was significant for 2 areas of amorphous calcifications in the right breast, each about 3 cm one at 11:30 and 1 at 12:30 position as well as one unchanged 5 mm oval mass but at 8 o'clock position a slightly enlarged 10 mm oval mass. Subsequently she underwent biopsies of all 3 of these areas. The oval mass was a fibroadenoma. The calcifications at 12:30 showed fibrocystic disease without atypia but the biopsy at the 11:30 position showed flat epithelial atypia as well as atypical lobular hyperplasia. She is referred for consideration for excisional biopsy. She has not had any breast symptoms, specifically lump or pain or nipple discharge or crusting.  Past Medical History:  Diagnosis Date  . Abnormal Pap smear of cervix 2001   --ascus  . Anemia   . Blood transfusion without reported diagnosis 01-31-00  . Gestational diabetes   . Headache    HX MIGRAINES   . Hyperthyroidism    W/ PREGNANCY  . Pregnancy induced hypertension   . Thyroid disease    only with pregnancy   Past Surgical History:  Procedure Laterality Date  . ABDOMINAL HYSTERECTOMY  10-11-07   TVH--ovaries remain     (LAPAROSCOPIC)  . BREAST SURGERY  07/2015   3 benign breast biopsies.  Atypical hyperplasia.  Marland Kitchen CESAREAN SECTION  01/2000, 05/2005  . CHOLECYSTECTOMY N/A 06/13/2015   Procedure: LAPAROSCOPIC CHOLECYSTECTOMY;  Surgeon: Greer Pickerel, MD;  Location: [place];  Service:  General;  Laterality: N/A;  . COLPOSCOPY  2001  . WISDOM TOOTH EXTRACTION  1995     Allergies  Zofran *ANTIEMETICS* Rash.  Medication History  Claritin (10MG  Tablet, Oral) Active. Adapalene (0.3% Gel, External) Active. Medications Reconciled  Vitals   Weight: 166 lb Height: 65in Body Surface Area: 1.83 m Body Mass Index: 27.62 kg/m  Temp.: 98.20F(Temporal)  Pulse: 85 (Regular)  BP: 126/74 (Sitting, Left Arm, Standard)       Physical Exam  The physical exam findings are as follows: Note:General: Alert, well-developed and well nourished Caucasian female, in no distress Skin: Warm and dry without rash or infection. HEENT: No palpable masses or thyromegaly. Sclera nonicteric. Pupils equal round and reactive. Lymph nodes: No cervical, supraclavicular, nodes palpable. Breasts: No palpable masses in either breast. No skin changes or nipple crusting. No palpable axillary adenopathy. Lungs: Breath sounds clear and equal. No wheezing or increased work of breathing. Cardiovascular: Regular rate and rhythm without murmer. No JVD or edema. Peripheral pulses intact. No carotid bruits. Extremities: No edema or joint swelling or deformity. No chronic venous stasis changes. Neurologic: Alert and fully oriented. Gait normal. No focal weakness. Psychiatric: Normal mood and affect. Thought content appropriate with normal judgement and insight    Assessment & Plan  ATYPICAL LOBULAR HYPERPLASIA (ALH) OF RIGHT BREAST (N60.91) Impression: New area of calcifications in the right breast with large core needle biopsy revealing atypical lobular hyperplasia at the 11:30 position. She also has a new area of calcifications at the 12:30 position which  has revealed fibrocystic change. I discussed these findings in detail with the patient and her husband. We discussed that there is a small chance of an underlying malignancy associated with the atypical hyperplasia and that the standard  recommendation would be excisional biopsy. We discussed options of close imaging follow-up as well. She wants the area removed. She is also concerned about the fact there are new calcifications at the 12:30 position. We discussed that these are entirely benign but she feels very uncomfortable with them and would like them removed as well. Since they are in a similar position and I believe we can get both areas out with 1 incision I think for her peace of mind it is reasonable to excise both areas and we will plan to do this. We discussed the surgery and recovery and risk of anesthetic complications, bleeding, infection. All their questions were answered. Current Plans Right breast lumpectomy 2 under general anesthesia as an outpatient, one seed and one wire

## 2015-10-09 NOTE — Anesthesia Postprocedure Evaluation (Signed)
Anesthesia Post Note  Patient: Alicia Alexander  Procedure(s) Performed: Procedure(s) (LRB): RIGHT BREAST LUMPECTOMY X 2 WITH (1) RADIOACTIVE SEED AND (1) WIRE LOCALIZATION (Right)  Patient location during evaluation: PACU Anesthesia Type: General Level of consciousness: awake and alert Pain management: pain level controlled Vital Signs Assessment: post-procedure vital signs reviewed and stable Respiratory status: spontaneous breathing, nonlabored ventilation and respiratory function stable Cardiovascular status: blood pressure returned to baseline and stable Postop Assessment: no signs of nausea or vomiting Anesthetic complications: no    Last Vitals:  Vitals:   10/09/15 1230 10/09/15 1240  BP: 123/83   Pulse: 65 90  Resp: 13 16  Temp:      Last Pain:  Vitals:   10/09/15 1240  TempSrc:   PainSc: 1                  Jaine Estabrooks,W. EDMOND

## 2015-10-09 NOTE — Anesthesia Procedure Notes (Signed)
Procedure Name: LMA Insertion Date/Time: 10/09/2015 10:15 AM Performed by: Toula Moos L Pre-anesthesia Checklist: Patient identified, Emergency Drugs available, Suction available, Patient being monitored and Timeout performed Patient Re-evaluated:Patient Re-evaluated prior to inductionOxygen Delivery Method: Circle system utilized Preoxygenation: Pre-oxygenation with 100% oxygen Intubation Type: IV induction Ventilation: Mask ventilation without difficulty LMA: LMA inserted LMA Size: 4.0 Number of attempts: 1 Airway Equipment and Method: Bite block Placement Confirmation: positive ETCO2 Tube secured with: Tape Dental Injury: Teeth and Oropharynx as per pre-operative assessment

## 2015-10-09 NOTE — Transfer of Care (Signed)
Immediate Anesthesia Transfer of Care Note  Patient: Alicia Alexander  Procedure(s) Performed: Procedure(s): RIGHT BREAST LUMPECTOMY X 2 WITH (1) RADIOACTIVE SEED AND (1) WIRE LOCALIZATION (Right)  Patient Location: PACU  Anesthesia Type:General  Level of Consciousness: sedated  Airway & Oxygen Therapy: Patient Spontanous Breathing and Patient connected to face mask oxygen  Post-op Assessment: Report given to RN and Post -op Vital signs reviewed and stable  Post vital signs: Reviewed and stable  Last Vitals:  Vitals:   10/09/15 1142 10/09/15 1143  BP: 134/87   Pulse: 92 86  Resp:  14  Temp:      Last Pain:  Vitals:   10/09/15 0922  TempSrc: Oral         Complications: No apparent anesthesia complications

## 2015-10-09 NOTE — Op Note (Signed)
Preoperative Diagnosis: RIGHT BREAST ATYPICAL LOBULAR HYPERPLASIA  Postoprative Diagnosis: RIGHT BREAST ATYPICAL LOBULAR HYPERPLASIA  Procedure: Procedure(s): RIGHT BREAST LUMPECTOMY X 2 WITH (1) RADIOACTIVE SEED AND (1) WIRE LOCALIZATION   Surgeon: Excell Seltzer T   Assistants: None  Anesthesia:  General LMA anesthesia  Indications: Patient is a 40 year old female with a recent abnormal mammogram showing 2 areas of abnormal calcification in the upper right breast. A large core needle biopsy of both areas was performed with one area revealing atypical ductal hyperplasia and the other benign fibrocystic changes. After discussion and consultation with the patient we have elected to proceed with excisional biopsy of both areas. I discussed that the area of benign fibrocystic change does not require excision but she feels strongly that she wants that area removed at the same time. We discussed the procedure and risks and recovery detailed elsewhere.    Procedure Detail:  Preoperatively the patient had undergone accurate placement of a radioactive seed at the site of atypical ductal hyperplasia. After consultation with the radiologist as the area of fibrocystic calcifications was relatively close to the ADH but not necessarily amenable to excision with 1 specimen we elected to localize the area of fibrocystic calcifications with a wire and this was performed this morning. See placement was confirmed in the holding area. She was taken operating room, placed in supine position on the operating table and laryngeal mask anesthesia induced. The right breast was widely sterilely prepped and draped. She received preoperative IV antibiotics. PAS were in place. Patient timeout was performed and correct procedure verified. The seed was localized in the upper right breast near the areolar border. A circumareolar incision was used to dissect carried down to the subcutaneous tissue. The seed was deep in the  breast tissue per the neoprobe and dissection was deepened down through the breast tissue toward the chest wall. As the seed was approached the excision was taken out in all directions and taken down to the chest wall. The seed was seen and was actually on the chest wall and displaced during the dissection and removed separately. The specimen was excised from the chest wall. This was oriented with ink. Specimen x-ray which included the seed in the separate cup was obtained showing the marking clip and calcifications well contained within the specimen. During the dissection the tip of the wire which had been placed from a medial approach was seen. This area was medial to the initial excision. Through the same incision dissection was taken medially through deep breast tissue as the wire was also near the chest wall and as I went far enough to go past the thickened area of the wire a specimen of breast tissue was excised around the shaft of the tip of the wire which was removed. Specimen x-ray showed calcifications in the intact wire within the specimen but did not show the marking clip. I took a little bit more medial tissue from this excision and imaging show the marking clip within this. The soft tissue was extensively infiltrated with Marcaine. These areas were marked with clips. The deep breast and saphenous tissue was closed with interrupted 3-0 Vicryl and skin closed with subcuticular 5-0 Monocryl and Dermabond. Sponge and needle and instrument counts were correct.    Findings: As above  Estimated Blood Loss:  Minimal         Drains: None  Blood Given: none          Specimens: #1 right breast tissue(preop ADH)   #2 right  breast tissue (preop fibrocystic change with calcifications)  3 further medial breast tissue        Complications:  * No complications entered in OR log *         Disposition: PACU - hemodynamically stable.         Condition: stable

## 2015-10-09 NOTE — Discharge Instructions (Signed)
[facility] °Office Phone Number [phone] ° °BREAST BIOPSY/ PARTIAL MASTECTOMY: POST OP INSTRUCTIONS ° °Always review your discharge instruction sheet given to you by the facility where your surgery was performed. ° °IF YOU HAVE DISABILITY OR FAMILY LEAVE FORMS, YOU MUST BRING THEM TO THE OFFICE FOR PROCESSING.  DO NOT GIVE THEM TO YOUR DOCTOR. ° °1. A prescription for pain medication may be given to you upon discharge.  Take your pain medication as prescribed, if needed.  If narcotic pain medicine is not needed, then you may take acetaminophen (Tylenol) or ibuprofen (Advil) as needed. °2. Take your usually prescribed medications unless otherwise directed °3. If you need a refill on your pain medication, please contact your pharmacy.  They will contact our office to request authorization.  Prescriptions will not be filled after 5pm or on week-ends. °4. You should eat very light the first 24 hours after surgery, such as soup, crackers, pudding, etc.  Resume your normal diet the day after surgery. °5. Most patients will experience some swelling and bruising in the breast.  Ice packs and a good support bra will help.  Swelling and bruising can take several days to resolve.  °6. It is common to experience some constipation if taking pain medication after surgery.  Increasing fluid intake and taking a stool softener will usually help or prevent this problem from occurring.  A mild laxative (Milk of Magnesia or Miralax) should be taken according to package directions if there are no bowel movements after 48 hours. °7. Unless discharge instructions indicate otherwise, you may remove your bandages 24-48 hours after surgery, and you may shower at that time.  You may have steri-strips (small skin tapes) in place directly over the incision.  These strips should be left on the skin for 7-10 days.  If your surgeon used skin glue on the incision, you may shower in 24 hours.  The glue will flake off over the  next 2-3 weeks.  Any sutures or staples will be removed at the office during your follow-up visit. °8. ACTIVITIES:  You may resume regular daily activities (gradually increasing) beginning the next day.  Wearing a good support bra or sports bra minimizes pain and swelling.  You may have sexual intercourse when it is comfortable. °a. You may drive when you no longer are taking prescription pain medication, you can comfortably wear a seatbelt, and you can safely maneuver your car and apply brakes. °b. RETURN TO WORK:  ______________________________________________________________________________________ °9. You should see your doctor in the office for a follow-up appointment approximately two weeks after your surgery.  Your doctor’s nurse will typically make your follow-up appointment when she calls you with your pathology report.  Expect your pathology report 2-3 business days after your surgery.  You may call to check if you do not hear from us after three days. °10. OTHER INSTRUCTIONS: _______________________________________________________________________________________________ _____________________________________________________________________________________________________________________________________ °_____________________________________________________________________________________________________________________________________ °_____________________________________________________________________________________________________________________________________ ° °WHEN TO CALL YOUR DOCTOR: °1. Fever over 101.0 °2. Nausea and/or vomiting. °3. Extreme swelling or bruising. °4. Continued bleeding from incision. °5. Increased pain, redness, or drainage from the incision. ° °The clinic staff is available to answer your questions during regular business hours.  Please don’t hesitate to call and ask to speak to one of the nurses for clinical concerns.  If you have a medical emergency, go to the nearest  emergency room or call 911.  A surgeon from [facility] is always on call at the hospital. ° °For further questions, please visit [email or web address]  ° ° ° °  Post Anesthesia Home Care Instructions ° °Activity: °Get plenty of rest for the remainder of the day. A responsible adult should stay with you for 24 hours following the procedure.  °For the next 24 hours, DO NOT: °-Drive a car °-Operate machinery °-Drink alcoholic beverages °-Take any medication unless instructed by your physician °-Make any legal decisions or sign important papers. ° °Meals: °Start with liquid foods such as gelatin or soup. Progress to regular foods as tolerated. Avoid greasy, spicy, heavy foods. If nausea and/or vomiting occur, drink only clear liquids until the nausea and/or vomiting subsides. Call your physician if vomiting continues. ° °Special Instructions/Symptoms: °Your throat may feel dry or sore from the anesthesia or the breathing tube placed in your throat during surgery. If this causes discomfort, gargle with warm salt water. The discomfort should disappear within 24 hours. ° °If you had a scopolamine patch placed behind your ear for the management of post- operative nausea and/or vomiting: ° °1. The medication in the patch is effective for 72 hours, after which it should be removed.  Wrap patch in a tissue and discard in the trash. Wash hands thoroughly with soap and water. °2. You may remove the patch earlier than 72 hours if you experience unpleasant side effects which may include dry mouth, dizziness or visual disturbances. °3. Avoid touching the patch. Wash your hands with soap and water after contact with the patch. °  ° °

## 2015-10-09 NOTE — Interval H&P Note (Signed)
History and Physical Interval Note:  10/09/2015 10:00 AM  Alicia Alexander  has presented today for surgery, with the diagnosis of RIGHT BREAST ATYPICAL LOBULAR HYPERPLASIA  The various methods of treatment have been discussed with the patient and family. After consideration of risks, benefits and other options for treatment, the patient has consented to  Procedure(s): RIGHT BREAST LUMPECTOMY X 2 WITH (1) RADIOACTIVE SEED AND (1) WIRE LOCALIZATION (Right) as a surgical intervention .  The patient's history has been reviewed, patient examined, no change in status, stable for surgery.  I have reviewed the patient's chart and labs.  Questions were answered to the patient's satisfaction.     [name] T

## 2015-10-09 NOTE — Anesthesia Preprocedure Evaluation (Addendum)
Anesthesia Evaluation  Patient identified by MRN, date of birth, ID band Patient awake    Reviewed: Allergy & Precautions, H&P , NPO status , Patient's Chart, lab work & pertinent test results  Airway Mallampati: II  TM Distance: >3 FB Neck ROM: Full    Dental no notable dental hx. (+) Teeth Intact, Dental Advisory Given   Pulmonary neg pulmonary ROS,    Pulmonary exam normal breath sounds clear to auscultation       Cardiovascular hypertension, negative cardio ROS   Rhythm:Regular Rate:Normal     Neuro/Psych  Headaches, negative psych ROS   GI/Hepatic negative GI ROS, Neg liver ROS,   Endo/Other  negative endocrine ROSdiabetes, Gestational  Renal/GU negative Renal ROS  negative genitourinary   Musculoskeletal   Abdominal   Peds  Hematology negative hematology ROS (+)   Anesthesia Other Findings   Reproductive/Obstetrics negative OB ROS                           Anesthesia Physical Anesthesia Plan  ASA: II  Anesthesia Plan: General   Post-op Pain Management:    Induction: Intravenous  Airway Management Planned: LMA  Additional Equipment:   Intra-op Plan:   Post-operative Plan: Extubation in OR  Informed Consent: I have reviewed the patients History and Physical, chart, labs and discussed the procedure including the risks, benefits and alternatives for the proposed anesthesia with the patient or authorized representative who has indicated his/her understanding and acceptance.   Dental advisory given  Plan Discussed with: CRNA  Anesthesia Plan Comments:         Anesthesia Quick Evaluation

## 2015-10-11 ENCOUNTER — Encounter (HOSPITAL_BASED_OUTPATIENT_CLINIC_OR_DEPARTMENT_OTHER): Payer: Self-pay | Admitting: General Surgery

## 2015-10-11 HISTORY — PX: BREAST EXCISIONAL BIOPSY: SUR124

## 2015-11-04 ENCOUNTER — Telehealth: Payer: Self-pay | Admitting: Oncology

## 2015-11-04 ENCOUNTER — Encounter: Payer: Self-pay | Admitting: Oncology

## 2015-11-04 NOTE — Telephone Encounter (Signed)
Pt returned call to schedule appt w/Magrin[facility]. Appt scheduled for 11/13 at 430pm. Pt aware to arrive 30 minutes early. Letter mailed to the patient.

## 2015-11-06 ENCOUNTER — Other Ambulatory Visit: Payer: Self-pay | Admitting: Obstetrics and Gynecology

## 2015-11-06 DIAGNOSIS — N951 Menopausal and female climacteric states: Secondary | ICD-10-CM

## 2015-11-06 DIAGNOSIS — Z0000 Encounter for general adult medical examination without abnormal findings: Secondary | ICD-10-CM

## 2015-11-12 ENCOUNTER — Other Ambulatory Visit: Payer: 59

## 2015-11-12 DIAGNOSIS — N951 Menopausal and female climacteric states: Secondary | ICD-10-CM

## 2015-11-12 DIAGNOSIS — Z0000 Encounter for general adult medical examination without abnormal findings: Secondary | ICD-10-CM

## 2015-11-12 DIAGNOSIS — E559 Vitamin D deficiency, unspecified: Secondary | ICD-10-CM

## 2015-11-12 LAB — COMPREHENSIVE METABOLIC PANEL
ALT: 19 U/L (ref 6–29)
AST: 14 U/L (ref 10–30)
Albumin: 4 g/dL (ref 3.6–5.1)
Alkaline Phosphatase: 68 U/L (ref 33–115)
BUN: 9 mg/dL (ref 7–25)
CHLORIDE: 102 mmol/L (ref 98–110)
CO2: 24 mmol/L (ref 20–31)
Calcium: 9.1 mg/dL (ref 8.6–10.2)
Creat: 0.55 mg/dL (ref 0.50–1.10)
GLUCOSE: 77 mg/dL (ref 65–99)
POTASSIUM: 4.4 mmol/L (ref 3.5–5.3)
Sodium: 136 mmol/L (ref 135–146)
Total Bilirubin: 0.4 mg/dL (ref 0.2–1.2)
Total Protein: 7.3 g/dL (ref 6.1–8.1)

## 2015-11-12 LAB — CBC
HEMATOCRIT: 43.2 % (ref 35.0–45.0)
HEMOGLOBIN: 13.8 g/dL (ref 11.7–15.5)
MCH: 28.3 pg (ref 27.0–33.0)
MCHC: 31.9 g/dL — AB (ref 32.0–36.0)
MCV: 88.7 fL (ref 80.0–100.0)
MPV: 12 fL (ref 7.5–12.5)
Platelets: 349 10*3/uL (ref 140–400)
RBC: 4.87 MIL/uL (ref 3.80–5.10)
RDW: 13.6 % (ref 11.0–15.0)
WBC: 6 10*3/uL (ref 3.8–10.8)

## 2015-11-12 LAB — LIPID PANEL
Cholesterol: 115 mg/dL — ABNORMAL LOW (ref 125–200)
HDL: 57 mg/dL (ref >=46)
LDL CALC: 45 mg/dL (ref <130)
TRIGLYCERIDES: 64 mg/dL (ref <150)
Total CHOL/HDL Ratio: 2 Ratio (ref <=5.0)
VLDL: 13 mg/dL (ref <30)

## 2015-11-12 LAB — TSH: TSH: 0.92 mIU/L

## 2015-11-13 ENCOUNTER — Encounter: Payer: Self-pay | Admitting: Obstetrics and Gynecology

## 2015-11-13 LAB — VITAMIN D 25 HYDROXY (VIT D DEFICIENCY, FRACTURES): Vit D, 25-Hydroxy: 23 ng/mL — ABNORMAL LOW (ref 30–100)

## 2015-11-13 LAB — FOLLICLE STIMULATING HORMONE: FSH: 15.8 m[IU]/mL

## 2015-11-13 MED ORDER — VITAMIN D (ERGOCALCIFEROL) 1.25 MG (50000 UNIT) PO CAPS: 50000.0000 [IU] | ORAL_CAPSULE | ORAL | 0 refills | Status: DC

## 2015-11-13 NOTE — Progress Notes (Signed)
40 y.o. G65P2012 Married Caucasian female here for annual exam.    Had labs and had low vit D and elevated FSH.  Having night sweats.  Wants help with this.   Feeling stressed with personal and family health issues.  Took Prozac in the past for a couple of months prior to hysterectomy.   Has an appointment with Dr. Jana Hakim on 11/25/15 in follow up to breast biopsy showing atypia.   PCP: none   No LMP recorded. Patient has had a hysterectomy.       Sexually active: Yes.   mLW The current method of family planning is status post hysterectomy--ovaries remain.    Exercising: Yes.    Walks 3-5x/week Smoker:  no  Health Maintenance: Pap:  06-08-13 Neg:Neg HR HPV History of abnormal Pap:  Yes,  2001 Ascus pap with Colposcopy and had TCA treatment to cervix.  No documented dysplasia of cervix per patient.  Final hyst pathology - benign cervix with chronic cervicitis.  MMG: See Epic.  New diagnosis of right breast flat epithelial atypia, atypical lobular hyperplasia, and beningn fibroadenoma. Colonoscopy:  n/a BMD:   n/a  Result  n/a TDaP:  10/2005 Gardasil:   N/A HIV:  Negative in pregnancy and several years ago through work.  Hep C:   NA Screening Labs:  Hb today: 13.8 on 11-12-15, Urine today: Trace WBCs--asymptomatic   reports that she has never smoked. She has never used smokeless tobacco. She reports that she drinks alcohol. She reports that she does not use drugs.  Past Medical History:  Diagnosis Date  . Abnormal Pap smear of cervix 2001   --[place]  . Anemia   . Blood transfusion without reported diagnosis 01-31-00  . Gestational diabetes   . Headache    HX MIGRAINES   . Hyperthyroidism    W/ PREGNANCY  . Pregnancy induced hypertension   . Thyroid disease    only with pregnancy  . Vitamin D deficiency     Past Surgical History:  Procedure Laterality Date  . ABDOMINAL HYSTERECTOMY  10-11-07   TVH--ovaries remain     (LAPAROSCOPIC)  . BREAST LUMPECTOMY WITH RADIOACTIVE  SEED LOCALIZATION Right 10/09/2015   Procedure: RIGHT BREAST LUMPECTOMY X 2 WITH (1) RADIOACTIVE SEED AND (1) WIRE LOCALIZATION;  Surgeon: Excell Seltzer, MD;  Location: [facility];  Service: General;  Laterality: Right;  . BREAST SURGERY  07/2015   3 benign breast biopsies.  Atypical hyperplasia.  Marland Kitchen CESAREAN SECTION  01/2000, 05/2005  . CHOLECYSTECTOMY N/A 06/13/2015   Procedure: LAPAROSCOPIC CHOLECYSTECTOMY;  Surgeon: Greer Pickerel, MD;  Location: [facility];  Service: General;  Laterality: N/A;  . COLPOSCOPY  2001  . WISDOM TOOTH EXTRACTION  1995    Current Outpatient Prescriptions  Medication Sig Dispense Refill  . loratadine (CLARITIN) 10 MG tablet Take 10 mg by mouth daily as needed for allergies or rhinitis.     . Vitamin D, Ergocalciferol, (DRISDOL) 50000 units CAPS capsule Take 1 capsule (50,000 Units total) by mouth every 14 (fourteen) days. (Patient not taking: Reported on 11/14/2015) 6 capsule 0   No current facility-administered medications for this visit.     Family History  Problem Relation Age of Onset  . COPD Mother   . Emphysema Mother   . Thyroid disease Mother     hypothyroid  . Atrial fibrillation Mother   . Osteopenia Mother   . Cancer Paternal Grandfather     dec--pancreatic ca  . Diabetes Father  AODM  . Heart attack Father   . Breast cancer Maternal Aunt   . Lupus Maternal Aunt     ROS:  Pertinent items are noted in HPI.  Otherwise, a comprehensive ROS was negative.  Exam:   BP (!) 144/86 (BP Location: Right Arm, Patient Position: Sitting, Cuff Size: Normal)   Pulse 76   Resp 20   Ht 5' 4.5" (1.638 m)   Wt 167 lb (75.8 kg)   BMI 28.22 kg/m     General appearance: alert, cooperative and appears stated age Head: Normocephalic, without obvious abnormality, atraumatic Neck: no adenopathy, supple, symmetrical, trachea midline and thyroid normal to inspection and palpation Lungs: clear to auscultation bilaterally Breasts: right breast with  well healed periareolar incision, left breast normal appearance, no masses or tenderness, No nipple retraction or dimpling, No nipple discharge or bleeding, No axillary or supraclavicular adenopathy Heart: regular rate and rhythm Abdomen: soft, non-tender; no masses, no organomegaly Extremities: extremities normal, atraumatic, no cyanosis or edema Skin: Skin color, texture, turgor normal. No rashes or lesions Lymph nodes: Cervical, supraclavicular, and axillary nodes normal. No abnormal inguinal nodes palpated Neurologic: Grossly normal  Pelvic: External genitalia:  no lesions              Urethra:  normal appearing urethra with no masses, tenderness or lesions              Bartholins and Skenes: normal                 Vagina: normal appearing vagina with normal color and discharge, no lesions              Cervix: absent.              Pap taken: no Bimanual Exam:  Uterus:  normal size, contour, position, consistency, mobility, non-tender              Adnexa: no mass, fullness, tenderness              Rectal exam: Yes.  .  Confirms.              Anus:  normal sphincter tone, no lesions  Chaperone was present for exam.  Assessment:   Well woman visit with normal exam. Perimenopause. Menopausal symptoms.  Right breast flat epithelial atypia, atypical lobular hyperplasia, and beningn fibroadenoma. Low vit D.  Situational stress.  Plan: Yearly mammogram recommended after age 54.  Recommended self breast exam.  Pap and HR HPV as above. Guidelines for Calcium, Vitamin D, regular exercise program including cardiovascular and weight bearing exercise. Tdap.  Start Effexor XR 37.5 mg daily.  Discussed side effects.  Referral to [facility] counseling. Brochure given and patient will consider making an appointment.  Vit D 50, 000 IU every 2 weeks for 3 months.  Follow up in 6 weeks.  Recheck vit D in 3 months.  Keep appt with Dr. Jana Hakim to discuss breast imaging recommendations and  strategies for reduction of risk of breast cancer. Follow up annually and prn.      After visit summary provided.

## 2015-11-14 ENCOUNTER — Ambulatory Visit (INDEPENDENT_AMBULATORY_CARE_PROVIDER_SITE_OTHER): Payer: 59 | Admitting: Obstetrics and Gynecology

## 2015-11-14 ENCOUNTER — Encounter: Payer: Self-pay | Admitting: Obstetrics and Gynecology

## 2015-11-14 VITALS — BP 144/86 | HR 76 | Resp 20 | Ht 64.5 in | Wt 167.0 lb

## 2015-11-14 DIAGNOSIS — Z01419 Encounter for gynecological examination (general) (routine) without abnormal findings: Secondary | ICD-10-CM

## 2015-11-14 DIAGNOSIS — N951 Menopausal and female climacteric states: Secondary | ICD-10-CM

## 2015-11-14 DIAGNOSIS — Z23 Encounter for immunization: Secondary | ICD-10-CM

## 2015-11-14 DIAGNOSIS — Z0000 Encounter for general adult medical examination without abnormal findings: Secondary | ICD-10-CM

## 2015-11-14 LAB — POCT URINALYSIS DIPSTICK
BILIRUBIN UA: NEGATIVE
GLUCOSE UA: NEGATIVE
Ketones, UA: NEGATIVE
Nitrite, UA: NEGATIVE
Protein, UA: NEGATIVE
RBC UA: NEGATIVE
UROBILINOGEN UA: NEGATIVE
pH, UA: 5

## 2015-11-14 MED ORDER — VENLAFAXINE HCL ER 37.5 MG PO CP24: 37.5000 mg | ORAL_CAPSULE | Freq: Every day | ORAL | 5 refills | Status: DC

## 2015-11-14 MED ORDER — VITAMIN D (ERGOCALCIFEROL) 1.25 MG (50000 UNIT) PO CAPS: 50000.0000 [IU] | ORAL_CAPSULE | ORAL | 0 refills | Status: DC

## 2015-11-14 MED FILL — VENLAFAXINE HCL ER 37.5 MG: 37.5 | 30 days supply | Qty: 30 | Fill #0

## 2015-11-14 MED FILL — VIT D2 1.25 MG (50,000 UNIT: 1.25 MG | 84 days supply | Qty: 6 | Fill #0

## 2015-11-14 NOTE — Patient Instructions (Signed)
EXERCISE AND DIET:  We recommended that you start or continue a regular exercise program for good health. Regular exercise means any activity that makes your heart beat faster and makes you sweat.  We recommend exercising at least 30 minutes per day at least 3 days a week, preferably 4 or 5.  We also recommend a diet low in fat and sugar.  Inactivity, poor dietary choices and obesity can cause diabetes, heart attack, stroke, and kidney damage, among others.    ALCOHOL AND SMOKING:  Women should limit their alcohol intake to no more than 7 drinks/beers/glasses of wine (combined, not each!) per week. Moderation of alcohol intake to this level decreases your risk of breast cancer and liver damage. And of course, no recreational drugs are part of a healthy lifestyle.  And absolutely no smoking or even second hand smoke. Most people know smoking can cause heart and lung diseases, but did you know it also contributes to weakening of your bones? Aging of your skin?  Yellowing of your teeth and nails?  CALCIUM AND VITAMIN D:  Adequate intake of calcium and Vitamin D are recommended.  The recommendations for exact amounts of these supplements seem to change often, but generally speaking 600 mg of calcium (either carbonate or citrate) and 800 units of Vitamin D per day seems prudent. Certain women may benefit from higher intake of Vitamin D.  If you are among these women, your doctor will have told you during your visit.    PAP SMEARS:  Pap smears, to check for cervical cancer or precancers,  have traditionally been done yearly, although recent scientific advances have shown that most women can have pap smears less often.  However, every woman still should have a physical exam from her gynecologist every year. It will include a breast check, inspection of the vulva and vagina to check for abnormal growths or skin changes, a visual exam of the cervix, and then an exam to evaluate the size and shape of the uterus and  ovaries.  And after 42 years of age, a rectal exam is indicated to check for rectal cancers. We will also provide age appropriate advice regarding health maintenance, like when you should have certain vaccines, screening for sexually transmitted diseases, bone density testing, colonoscopy, mammograms, etc.   MAMMOGRAMS:  All women over 40 years old should have a yearly mammogram. Many facilities now offer a "3D" mammogram, which may cost around $50 extra out of pocket. If possible,  we recommend you accept the option to have the 3D mammogram performed.  It both reduces the number of women who will be called back for extra views which then turn out to be normal, and it is better than the routine mammogram at detecting truly abnormal areas.    COLONOSCOPY:  Colonoscopy to screen for colon cancer is recommended for all women at age 50.  We know, you hate the idea of the prep.  We agree, BUT, having colon cancer and not knowing it is worse!!  Colon cancer so often starts as a polyp that can be seen and removed at colonscopy, which can quite literally save your life!  And if your first colonoscopy is normal and you have no family history of colon cancer, most women don't have to have it again for 10 years.  Once every ten years, you can do something that may end up saving your life, right?  We will be happy to help you get it scheduled when you are ready.    Be sure to check your insurance coverage so you understand how much it will cost.  It may be covered as a preventative service at no cost, but you should check your particular policy.     Venlafaxine extended-release capsules What is this medicine? VENLAFAXINE(VEN la fax een) is used to treat depression, anxiety and panic disorder. This medicine may be used for other purposes; ask your health care provider or pharmacist if you have questions. What should I tell my health care provider before I take this medicine? They need to know if you have any of these  conditions: -bleeding disorders -glaucoma -heart disease -high blood pressure -high cholesterol -kidney disease -liver disease -low levels of sodium in the blood -mania or bipolar disorder -seizures -suicidal thoughts, plans, or attempt; a previous suicide attempt by you or a family -take medicines that treat or prevent blood clots -thyroid disease -an unusual or allergic reaction to venlafaxine, desvenlafaxine, other medicines, foods, dyes, or preservatives -pregnant or trying to get pregnant -breast-feeding How should I use this medicine? Take this medicine by mouth with a full glass of water. Follow the directions on the prescription label. Do not cut, crush, or chew this medicine. Take it with food. If needed, the capsule may be carefully opened and the entire contents sprinkled on a spoonful of cool applesauce. Swallow the applesauce/pellet mixture right away without chewing and follow with a glass of water to ensure complete swallowing of the pellets. Try to take your medicine at about the same time each day. Do not take your medicine more often than directed. Do not stop taking this medicine suddenly except upon the advice of your doctor. Stopping this medicine too quickly may cause serious side effects or your condition may worsen. A special MedGuide will be given to you by the pharmacist with each prescription and refill. Be sure to read this information carefully each time. Talk to your pediatrician regarding the use of this medicine in children. Special care may be needed. Overdosage: If you think you have taken too much of this medicine contact a poison control center or emergency room at once. NOTE: This medicine is only for you. Do not share this medicine with others. What if I miss a dose? If you miss a dose, take it as soon as you can. If it is almost time for your next dose, take only that dose. Do not take double or extra doses. What may interact with this medicine? Do not  take this medicine with any of the following medications: -certain medicines for fungal infections like fluconazole, itraconazole, ketoconazole, posaconazole, voriconazole -cisapride -desvenlafaxine -dofetilide -dronedarone -duloxetine -levomilnacipran -linezolid -MAOIs like Carbex, Eldepryl, Marplan, Nardil, and Parnate -methylene blue (injected into a vein) -milnacipran -pimozide -thioridazine -ziprasidone This medicine may also interact with the following medications: -aspirin and aspirin-like medicines -certain medicines for depression, anxiety, or psychotic disturbances -certain medicines for migraine headaches like almotriptan, eletriptan, frovatriptan, naratriptan, rizatriptan, sumatriptan, zolmitriptan -certain medicines for sleep -certain medicines that treat or prevent blood clots like dalteparin, enoxaparin, warfarin -cimetidine -clozapine -diuretics -fentanyl -furazolidone -indinavir -isoniazid -lithium -metoprolol -NSAIDS, medicines for pain and inflammation, like ibuprofen or naproxen -other medicines that prolong the QT interval (cause an abnormal heart rhythm) -procarbazine -rasagiline -supplements like St. John's wort, kava kava, valerian -tramadol -tryptophan This list may not describe all possible interactions. Give your health care provider a list of all the medicines, herbs, non-prescription drugs, or dietary supplements you use. Also tell them if you smoke, drink alcohol, or use  illegal drugs. Some items may interact with your medicine. What should I watch for while using this medicine? Tell your doctor if your symptoms do not get better or if they get worse. Visit your doctor or health care professional for regular checks on your progress. Because it may take several weeks to see the full effects of this medicine, it is important to continue your treatment as prescribed by your doctor. Patients and their families should watch out for new or worsening  thoughts of suicide or depression. Also watch out for sudden changes in feelings such as feeling anxious, agitated, panicky, irritable, hostile, aggressive, impulsive, severely restless, overly excited and hyperactive, or not being able to sleep. If this happens, especially at the beginning of treatment or after a change in dose, call your health care professional. This medicine can cause an increase in blood pressure. Check with your doctor for instructions on monitoring your blood pressure while taking this medicine. You may get drowsy or dizzy. Do not drive, use machinery, or do anything that needs mental alertness until you know how this medicine affects you. Do not stand or sit up quickly, especially if you are an older patient. This reduces the risk of dizzy or fainting spells. Alcohol may interfere with the effect of this medicine. Avoid alcoholic drinks. Your mouth may get dry. Chewing sugarless gum, sucking hard candy and drinking plenty of water will help. Contact your doctor if the problem does not go away or is severe. What side effects may I notice from receiving this medicine? Side effects that you should report to your doctor or health care professional as soon as possible: -allergic reactions like skin rash, itching or hives, swelling of the face, lips, or tongue -breathing problems -changes in vision -hallucination, loss of contact with reality -seizures -suicidal thoughts or other mood changes -trouble passing urine or change in the amount of urine -unusual bleeding or bruising Side effects that usually do not require medical attention (report to your doctor or health care professional if they continue or are bothersome): -change in sex drive or performance -constipation -increased sweating -loss of appetite -nausea -tremors -weight loss This list may not describe all possible side effects. Call your doctor for medical advice about side effects. You may report side effects to  FDA at 1-800-FDA-1088. Where should I keep my medicine? Keep out of the reach of children. Store at a controlled temperature between 20 and 25 degrees C (68 degrees and 77 degrees F), in a dry place. Throw away any unused medicine after the expiration date. NOTE: This sheet is a summary. It may not cover all possible information. If you have questions about this medicine, talk to your doctor, pharmacist, or health care provider.    2016, Elsevier/Gold Standard. (2012-07-26 12:46:03)

## 2015-11-22 ENCOUNTER — Other Ambulatory Visit: Payer: Self-pay | Admitting: *Deleted

## 2015-11-25 ENCOUNTER — Other Ambulatory Visit: Payer: 59

## 2015-11-25 ENCOUNTER — Ambulatory Visit (HOSPITAL_BASED_OUTPATIENT_CLINIC_OR_DEPARTMENT_OTHER): Payer: 59 | Admitting: Oncology

## 2015-11-25 VITALS — BP 145/90 | HR 98 | Temp 98.4°F | Resp 18 | Ht 64.5 in | Wt 166.6 lb

## 2015-11-25 DIAGNOSIS — Z800 Family history of malignant neoplasm of digestive organs: Secondary | ICD-10-CM

## 2015-11-25 DIAGNOSIS — Z803 Family history of malignant neoplasm of breast: Secondary | ICD-10-CM

## 2015-11-25 DIAGNOSIS — N6091 Unspecified benign mammary dysplasia of right breast: Secondary | ICD-10-CM

## 2015-11-25 MED ORDER — TAMOXIFEN CITRATE 20 MG PO TABS: 20.0000 mg | ORAL_TABLET | Freq: Every day | ORAL | 12 refills | Status: AC

## 2015-11-25 NOTE — Progress Notes (Signed)
[facility]  Telephone:([phone]) [phone] Fax:(336) [phone]     ID: Alicia Alexander DOB: 05-14-1975  MR#: [id number]  [id number]  Patient Care Team: No Pcp Per Patient as PCP - General (General Practice) Chauncey Cruel, MD as Consulting Physician (Oncology) Excell Seltzer, MD as Consulting Physician (General Surgery) Nunzio Cobbs, MD as Consulting Physician (Obstetrics and Gynecology) Carol Ada, MD as Consulting Physician (Gastroenterology) Chauncey Cruel, MD OTHER MD:  CHIEF COMPLAINT: Atypical lobular hyperplasia  CURRENT TREATMENT: Observation   BREAST CANCER HISTORY:   had screening mammography 07/26/2014 showing a possible mass in the right breast. She was referred to the breast Center where on 07/27/2014 she underwent right diagnostic mammography with tomosynthesis and right breast ultrasonography. The breast density was described as category C. Spot compression views showed a low-density mass in the upper inner right breast which was not palpable. Ultrasound confirmed a 0.6 cm hypoechoic oval palatal mass at the 1:00 position of the right breast 10 cm from the nipple. This was felt to be likely benign and six-month follow-up was recommended.  On 01/31/2015 the patient had repeat right diagnostic mammography with tomosynthesis and right breast ultrasonography. The mass in the upper inner quadrant of the right breast was unchanged by mammography and ultrasonography. There was a possible new circumscribed mass in the lower right breast which on ultrasound measured 0.8 cm at the 8:00 position 7 cm from the nipple. This was also felt to be likely benign. Six-month follow-up was again recommended.  On 08/01/2015 the patient underwent bilateral diagnostic mammography with tomography and right breast ultrasonography. This time the breast density was read as category D. The left breast was unremarkable. The breast mass at 1:00 10 cm from the nipple  was unchanged. The mass at 8:00 7 cm from the nipple had grown slightly to 1.0 cm. Now there were in addition 2 similar-appearing groups of amorphous calcifications, one measuring 2.9 cm at 11:30 PM and the second 3 cm at 12:30 PM. Both groups showed a linear distribution. The right axilla ultrasound was negative.  On 08/06/2015 the patient underwent biopsy of the 2 breast masses in question. The one at 11:30 o'clock showed atypical lobular hyperplasia. The one at the 8:00 position was a fibroadenoma. These results were felt to be concordant(SAA [id number].  Accordingly on 10/09/2015 the patient underwent right lumpectomy 2 and the pathology from this procedure [id number]) showed no evidence of malignancy.  Her subsequent history is as detailed below   INTERVAL HISTORY: Alicia Alexander was evaluated in the high risk clinic 11/25/2015  REVIEW OF SYSTEMS: She has some hot flashes and night sweats, as well as some vaginal dryness for which she was using local estrogen preparations. She describes herself was mildly fatigued. She is a little anxious but not depressed. She exercises chiefly by walking, which she denies for 2-4 miles about 5 days a week A detailed review of systems today was otherwise entirely negative.  PAST MEDICAL HISTORY: Past Medical History:  Diagnosis Date  . Abnormal Pap smear of cervix 2001   --[place]  . Anemia   . Blood transfusion without reported diagnosis 01-31-00  . Gestational diabetes   . Headache    HX MIGRAINES   . Hyperthyroidism    W/ PREGNANCY  . Pregnancy induced hypertension   . Thyroid disease    only with pregnancy  . Vitamin D deficiency     PAST SURGICAL HISTORY: Past Surgical History:  Procedure Laterality Date  . ABDOMINAL HYSTERECTOMY  10-11-07   TVH--ovaries remain     (LAPAROSCOPIC)  . BREAST LUMPECTOMY WITH RADIOACTIVE SEED LOCALIZATION Right 10/09/2015   Procedure: RIGHT BREAST LUMPECTOMY X 2 WITH (1) RADIOACTIVE SEED AND (1) WIRE LOCALIZATION;   Surgeon: Excell Seltzer, MD;  Location: [facility];  Service: General;  Laterality: Right;  . BREAST SURGERY  07/2015   3 benign breast biopsies.  Atypical hyperplasia.  Marland Kitchen CESAREAN SECTION  01/2000, 05/2005  . CHOLECYSTECTOMY N/A 06/13/2015   Procedure: LAPAROSCOPIC CHOLECYSTECTOMY;  Surgeon: Greer Pickerel, MD;  Location: [facility];  Service: General;  Laterality: N/A;  . COLPOSCOPY  2001  . WISDOM TOOTH EXTRACTION  1995    FAMILY HISTORY Family History  Problem Relation Age of Onset  . COPD Mother   . Emphysema Mother   . Thyroid disease Mother     hypothyroid  . Atrial fibrillation Mother   . Osteopenia Mother   . Cancer Paternal Grandfather     dec--pancreatic ca  . Diabetes Father     AODM  . Heart attack Father   . Breast cancer Maternal Aunt   . Lupus Maternal Aunt   The patient's parents are in their mid 69s as of November 2017. The patient has one brother, no sisters. A maternal aunt was diagnosed with breast cancer at age 61. A paternal grandfather was diagnosed with pancreatic cancer at age 41 and a maternal great grandmother had pancreatic cancer at age 31. There is no other history of breast or ovarian cancer in the family to the patient's knowledge  GYNECOLOGIC HISTORY:  No LMP recorded. Patient has had a hysterectomy. Menarche age 78, first live birth age 22, the patient is Alicia Alexander P2. She stopped having periods in September 2009. 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 She is married. Her husband Alicia Alexander is an Psychologist, educational. Their children are Alicia Alexander and Alicia Alexander, aged 41 and 10 as of November 2017.    ADVANCED DIRECTIVES: Not in place   HEALTH MAINTENANCE: Social History  Substance Use Topics  . Smoking status: Never Smoker  . Smokeless tobacco: Never Used  . Alcohol use 0.0  oz/week     Comment: occ.     Colonoscopy:  PAP:  Bone density:   Allergies  Allergen Reactions  . Zofran [Ondansetron Hcl] Hives    Current Outpatient Prescriptions  Medication Sig Dispense Refill  . loratadine (CLARITIN) 10 MG tablet Take 10 mg by mouth daily as needed for allergies or rhinitis.     Marland Kitchen venlafaxine XR (EFFEXOR XR) 37.5 MG 24 hr capsule Take 1 capsule (37.5 mg total) by mouth daily. 30 capsule 5  . Vitamin D, Ergocalciferol, (DRISDOL) 50000 units CAPS capsule Take 1 capsule (50,000 Units total) by mouth every 14 (fourteen) days. 6 capsule 0  . tamoxifen (NOLVADEX) 20 MG tablet Take 1 tablet (20 mg total) by mouth daily. 90 tablet 12   No current facility-administered medications for this visit.     OBJECTIVE: Young white woman who appears well Vitals:   11/25/15 1634  BP: (!) 145/90  Pulse: 98  Resp: 18  Temp: 98.4 F (36.9 C)     Body mass index is 28.16 kg/m.    ECOG FS:0 - Asymptomatic  Ocular: Sclerae unicteric, pupils equal, round and reactive to light Ear-nose-throat: Oropharynx clear and moist Lymphatic: No cervical  or supraclavicular adenopathy Lungs no rales or rhonchi, good excursion bilaterally Heart regular rate and rhythm, no murmur appreciated Abd soft, nontender, positive bowel sounds MSK no focal spinal tenderness, no joint edema Neuro: non-focal, well-oriented, appropriate affect Breasts: The right breast is status post lumpectomy. The cosmetic result is excellent. There is no palpable mass, and no skin or nipple changes of concern. The right axilla is benign. The left breast is unremarkable   LAB RESULTS:  CMP     Component Value Date/Time   NA 136 11/12/2015 1329   K 4.4 11/12/2015 1329   CL 102 11/12/2015 1329   CO2 24 11/12/2015 1329   GLUCOSE 77 11/12/2015 1329   BUN 9 11/12/2015 1329   CREATININE 0.55 11/12/2015 1329   CALCIUM 9.1 11/12/2015 1329   PROT 7.3 11/12/2015 1329   ALBUMIN 4.0 11/12/2015 1329   AST 14  11/12/2015 1329   ALT 19 11/12/2015 1329   ALKPHOS 68 11/12/2015 1329   BILITOT 0.4 11/12/2015 1329   GFRNONAA >60 06/06/2015 1131   GFRAA >60 06/06/2015 1131    INo results found for: SPEP, UPEP  Lab Results  Component Value Date   WBC 6.0 11/12/2015   HGB 13.8 11/12/2015   HCT 43.2 11/12/2015   MCV 88.7 11/12/2015   PLT 349 11/12/2015      Chemistry      Component Value Date/Time   NA 136 11/12/2015 1329   K 4.4 11/12/2015 1329   CL 102 11/12/2015 1329   CO2 24 11/12/2015 1329   BUN 9 11/12/2015 1329   CREATININE 0.55 11/12/2015 1329      Component Value Date/Time   CALCIUM 9.1 11/12/2015 1329   ALKPHOS 68 11/12/2015 1329   AST 14 11/12/2015 1329   ALT 19 11/12/2015 1329   BILITOT 0.4 11/12/2015 1329       No results found for: LABCA2  No components found for: LABCA125  No results for input(s): INR in the last 168 hours.  Urinalysis    Component Value Date/Time   BILIRUBINUR n 11/14/2015 1420   PROTEINUR n 11/14/2015 1420   UROBILINOGEN negative 11/14/2015 1420   NITRITE n 11/14/2015 1420   LEUKOCYTESUR Trace (A) 11/14/2015 1420     STUDIES: No results found.  ELIGIBLE FOR AVAILABLE RESEARCH PROTOCOL: no  ASSESSMENT: 40 y.o. [place] woman with a history of atypical lobular hyperplasia biopsied from the right breast 08/06/2015  (1) biopsy of a second area in the right breast 08/08/2015 showed only fibrocystic change  (2) right lumpectomy 10/11/2015 showed no evidence of malignancy or further atypical hyperplasia  PLAN: We spent the better part of today's hour-long appointment discussing the biology of breast cancer in general, and the specifics of the patient's situation in particular.Alicia Alexander understands that a breast lobule is what we call the many small milk glands located in both breasts, and that the word hyperplasia means literally "too much growth". Lobular hyperplasia means too much growth of the small milk glands. The word "atypical"  indicates that this growth is not only abnormal in amount but also somewhat abnormal in form or shape.   However it is important to understand that atypical lobular hyperplasia [facility]) in itself is not a cancer and it is not a precancer. We do not believe that this particular atypical lobule is going to turn into cancer if we leave it in place. For that reason it is not necessary to remove all ALH from the breast. The reason to do a lumpectomy as  Alicia Alexander underwent is to make sure there is no precancer or cancer associated with [facility]  We are concerned about Alicia Alexander because is a marker of breast cancer risk. A patient with atypical lobular hyperplasi will have an increased risk of developing breast cancer in either breast. Those cancers may be lobular or ductal, invasive or noninvasive. That risk approaches 1% per year.  There are several approaches that can be considered in this setting, and some that are discouraged. We do not encourage bilateral mastectomies for [facility]. There are other ways of dealing with this risk that are much less disruptive  A better approach to risk reduction in this setting involves anti-estrogens. Most breast cancers are estrogen dependent and if they are deprived of estrogen they will have trouble developing. There are 2 main choices among anti-estrogens. One group, tamoxifen and raloxifene (Evista), block the estrogen receptor so that the breast cells cannot "eat" estrogen and they starve. The second group, exemplified by anastrozole, blocks estrogen production.  A third approach is not to address the risk directly, but instead to intensified screening. This would involve obtaining an MRI of the breast yearly in addition to yearly mammography with tomography. The problems with this approach, aside from cost, is false positives. Breast MRI is very sensitive, and likely too find some lesions that may not be important but which may require biopsy or further studies. We do not have data that  yearly MRI in this setting improves overall survival.  Accordingly in Alicia Alexander's situation my recommendation is for anti-estrogens and specifically tamoxifen. She is a particularly good candidate for this and that she status post hysterectomy. Also she has been on estrogen treatment with no clotting complications.  We discussed the possible side effects toxicities and benefits of both tamoxifen and raloxifene. We are going to go with tamoxifen chiefly for reasons of cost. Also, it is worth pointing out that Alicia Alexander will be able to continue to use vaginal estrogen preparations so long as she is on tamoxifen.  Accordingly the overall plan is for her to take tamoxifen for 5 years. She will have a repeat right mammogram late December, which will serve as a new baseline. She will then have bilateral mammography in July and thereafter annually she has a good understanding of this plan, and agrees with it. She knows the goal of treatment in this case is prevention.  I will see her again in approximately 3 months and if she is tolerating tamoxifen well at that time we will consider releasing her back to her primary physician  Chauncey Cruel, MD   11/25/2015 5:47 PM Medical Oncology and Hematology [facility] [place], [place] [address] Tel. [phone]    Fax. [phone]

## 2015-11-26 MED FILL — TAMOXIFEN 20 MG TABLET: 20 | 90 days supply | Qty: 90 | Fill #0

## 2015-11-27 ENCOUNTER — Other Ambulatory Visit: Payer: Self-pay | Admitting: *Deleted

## 2015-11-27 DIAGNOSIS — N6091 Unspecified benign mammary dysplasia of right breast: Secondary | ICD-10-CM

## 2015-12-12 MED FILL — VENLAFAXINE HCL ER 37.5 MG: 37.5 | 30 days supply | Qty: 30 | Fill #1

## 2015-12-25 NOTE — Progress Notes (Signed)
GYNECOLOGY  VISIT   HPI: 40 y.o.   Married  Caucasian  female   [phone] with No LMP recorded. Patient has had a hysterectomy.   here for 6week follow up.  On Effexor 37.5 mg daily.  Tolerating it well. Christmas is easier this year. Sleeping OK.  Better energy.  Feeling on even ground now.   Son is doing better with his health.   Taking Vit D 50,000 po every 2 weeks.   Now on Tamoxifen 20 mg daily since 11/27/15 for reduction of risk of breast cancer. Having some hot flashes which are tolerable.  Can wake her up at 4 - 5 am and be quite strong. These are not daily.   Needing guidelines for when to increase her Effexor.  GYNECOLOGIC HISTORY: No LMP recorded. Patient has had a hysterectomy. Contraception:  hysterectomy Menopausal hormone therapy:  none Last mammogram:  See epic. New diagnosis of rt breast flat epithelial atypia, atypical lobular hyperplasia & benign fibroadenoma Last pap smear:   06-08-13 neg HPV HR neg        OB History    Gravida Para Term Preterm AB Living   3 2 2   1 2    SAB TAB Ectopic Multiple Live Births   1                 Patient Active Problem List   Diagnosis Date Noted  . Atypical lobular hyperplasia of right breast 08/22/2015    Past Medical History:  Diagnosis Date  . Abnormal Pap smear of cervix 2001   --ascus  . Anemia   . Blood transfusion without reported diagnosis 01-31-00  . Gestational diabetes   . Headache    HX MIGRAINES   . Hyperthyroidism    W/ PREGNANCY  . Pregnancy induced hypertension   . Thyroid disease    only with pregnancy  . Vitamin D deficiency     Past Surgical History:  Procedure Laterality Date  . ABDOMINAL HYSTERECTOMY  10-11-07   TVH--ovaries remain     (LAPAROSCOPIC)  . BREAST LUMPECTOMY WITH RADIOACTIVE SEED LOCALIZATION Right 10/09/2015   Procedure: RIGHT BREAST LUMPECTOMY X 2 WITH (1) RADIOACTIVE SEED AND (1) WIRE LOCALIZATION;  Surgeon: Excell Seltzer, MD;  Location: [facility];   Service: General;  Laterality: Right;  . BREAST SURGERY  07/2015   3 benign breast biopsies.  Atypical hyperplasia.  Marland Kitchen CESAREAN SECTION  01/2000, 05/2005  . CHOLECYSTECTOMY N/A 06/13/2015   Procedure: LAPAROSCOPIC CHOLECYSTECTOMY;  Surgeon: Greer Pickerel, MD;  Location: [facility];  Service: General;  Laterality: N/A;  . COLPOSCOPY  2001  . WISDOM TOOTH EXTRACTION  1995    Current Outpatient Prescriptions  Medication Sig Dispense Refill  . loratadine (CLARITIN) 10 MG tablet Take 10 mg by mouth daily as needed for allergies or rhinitis.     Marland Kitchen tamoxifen (NOLVADEX) 20 MG tablet Take 20 mg by mouth daily.    Marland Kitchen venlafaxine XR (EFFEXOR XR) 37.5 MG 24 hr capsule Take 1 capsule (37.5 mg total) by mouth daily. 30 capsule 5  . Vitamin D, Ergocalciferol, (DRISDOL) 50000 units CAPS capsule Take 1 capsule (50,000 Units total) by mouth every 14 (fourteen) days. 6 capsule 0   No current facility-administered medications for this visit.      ALLERGIES: Zofran [ondansetron hcl]  Family History  Problem Relation Age of Onset  . COPD Mother   . Emphysema Mother   . Thyroid disease Mother     hypothyroid  .  Atrial fibrillation Mother   . Osteopenia Mother   . Cancer Paternal Grandfather     dec--pancreatic ca  . Diabetes Father     AODM  . Heart attack Father   . Breast cancer Maternal Aunt   . Lupus Maternal Aunt     Social History   Social History  . Marital status: Married    Spouse name: N/A  . Number of children: N/A  . Years of education: N/A   Occupational History  . Not on file.   Social History Main Topics  . Smoking status: Never Smoker  . Smokeless tobacco: Never Used  . Alcohol use 0.0 oz/week     Comment: occ.  . Drug use: No  . Sexual activity: Yes    Partners: Male    Birth control/ protection: Surgical     Comment: TVH 2009   Other Topics Concern  . Not on file   Social History Narrative  . No narrative on file    ROS:  Pertinent items are noted in  HPI.  PHYSICAL EXAMINATION:    BP 122/80   Pulse 76   Resp 16   Ht 5' 4.5" (1.638 m)   Wt 165 lb (74.8 kg)   BMI 27.88 kg/m     General appearance: alert, cooperative and appears stated age  ASSESSMENT  Hx of atypical breast hyperplasia.  On Tamoxifen.  Status post hysterectomy. Menopausal symptoms.   Situational stress. All improved on Effexor 37.5 mg.   PLAN  Continue Effexor 37.5 mg.   We talked about increasing the Effexor if the hot flashes are affecting the quality of her life or if her mood and spirit are not allowing her to function well and enjoy life.   An After Visit Summary was printed and given to the patient.  __25____ minutes face to face time of which over 50% was spent in counseling.

## 2015-12-26 ENCOUNTER — Encounter: Payer: Self-pay | Admitting: Obstetrics and Gynecology

## 2015-12-26 ENCOUNTER — Ambulatory Visit (INDEPENDENT_AMBULATORY_CARE_PROVIDER_SITE_OTHER): Payer: 59 | Admitting: Obstetrics and Gynecology

## 2015-12-26 VITALS — BP 122/80 | HR 76 | Resp 16 | Ht 64.5 in | Wt 165.0 lb

## 2015-12-26 DIAGNOSIS — D225 Melanocytic nevi of trunk: Secondary | ICD-10-CM | POA: Diagnosis not present

## 2015-12-26 DIAGNOSIS — F439 Reaction to severe stress, unspecified: Secondary | ICD-10-CM

## 2015-12-26 DIAGNOSIS — L821 Other seborrheic keratosis: Secondary | ICD-10-CM | POA: Diagnosis not present

## 2015-12-26 DIAGNOSIS — Z86018 Personal history of other benign neoplasm: Secondary | ICD-10-CM | POA: Diagnosis not present

## 2015-12-26 DIAGNOSIS — L814 Other melanin hyperpigmentation: Secondary | ICD-10-CM | POA: Diagnosis not present

## 2015-12-26 DIAGNOSIS — N951 Menopausal and female climacteric states: Secondary | ICD-10-CM | POA: Diagnosis not present

## 2015-12-26 DIAGNOSIS — L700 Acne vulgaris: Secondary | ICD-10-CM | POA: Diagnosis not present

## 2015-12-26 DIAGNOSIS — Z23 Encounter for immunization: Secondary | ICD-10-CM | POA: Diagnosis not present

## 2015-12-26 DIAGNOSIS — D1800 Hemangioma unspecified site: Secondary | ICD-10-CM | POA: Diagnosis not present

## 2016-01-02 ENCOUNTER — Other Ambulatory Visit: Payer: Self-pay | Admitting: Oncology

## 2016-01-02 ENCOUNTER — Ambulatory Visit
Admission: RE | Admit: 2016-01-02 | Discharge: 2016-01-02 | Disposition: A | Discharge status: Home / Self Care | Payer: 59 | Source: Ambulatory Visit | Attending: Oncology | Admitting: Oncology

## 2016-01-02 DIAGNOSIS — N6312 Unspecified lump in the right breast, upper inner quadrant: Secondary | ICD-10-CM | POA: Diagnosis not present

## 2016-01-02 DIAGNOSIS — N6489 Other specified disorders of breast: Secondary | ICD-10-CM | POA: Diagnosis not present

## 2016-01-02 DIAGNOSIS — N6091 Unspecified benign mammary dysplasia of right breast: Secondary | ICD-10-CM

## 2016-01-02 DIAGNOSIS — R921 Mammographic calcification found on diagnostic imaging of breast: Secondary | ICD-10-CM | POA: Diagnosis not present

## 2016-01-09 MED FILL — VENLAFAXINE HCL ER 37.5 MG: 37.5 | 30 days supply | Qty: 30 | Fill #2

## 2016-01-30 ENCOUNTER — Other Ambulatory Visit: Payer: 59

## 2016-02-03 ENCOUNTER — Other Ambulatory Visit (INDEPENDENT_AMBULATORY_CARE_PROVIDER_SITE_OTHER): Payer: 59

## 2016-02-03 DIAGNOSIS — E559 Vitamin D deficiency, unspecified: Secondary | ICD-10-CM

## 2016-02-04 LAB — VITAMIN D 25 HYDROXY (VIT D DEFICIENCY, FRACTURES): Vit D, 25-Hydroxy: 35 ng/mL (ref 30–100)

## 2016-02-10 ENCOUNTER — Telehealth: Payer: Self-pay

## 2016-02-10 ENCOUNTER — Other Ambulatory Visit: Payer: Self-pay | Admitting: Obstetrics and Gynecology

## 2016-02-10 MED ORDER — VENLAFAXINE HCL ER 75 MG PO CP24: 75.0000 mg | ORAL_CAPSULE | Freq: Every day | ORAL | 0 refills | Status: DC

## 2016-02-10 MED FILL — VENLAFAXINE HCL ER 75 MG CA: 75 | 30 days supply | Qty: 30 | Fill #0

## 2016-02-10 NOTE — Telephone Encounter (Signed)
Patient states that she has had an increase in hot flashes and night sweats. Changing morning routine due to hot flashes. Patient would like an increase in Effexor. Please advise

## 2016-02-10 NOTE — Telephone Encounter (Signed)
Thank you for the update.  I have spoken with the patient directly regarding her increase hot flashes.  Will increase her Effexor XR to 75 mg daily.  I am sending this through to her pharmacy.

## 2016-02-26 MED FILL — TAMOXIFEN 20 MG TABLET: 20 | 90 days supply | Qty: 90 | Fill #1

## 2016-03-09 ENCOUNTER — Other Ambulatory Visit: Payer: Self-pay

## 2016-03-09 MED ORDER — VENLAFAXINE HCL ER 75 MG PO CP24: 75.0000 mg | ORAL_CAPSULE | Freq: Every day | ORAL | 8 refills | Status: DC

## 2016-03-09 MED FILL — VENLAFAXINE HCL ER 75 MG CA: 75 | 30 days supply | Qty: 30 | Fill #0

## 2016-03-09 NOTE — Telephone Encounter (Addendum)
Medication refill request: Venlafaxine Last AEX:  11/14/15 BS Next AEX: 11/26/16 BS Last MMG (if hormonal medication request): See EPIC. New diagnosis of R Breast flat epithelial atypia, atypical lobular hyperplasia, and benign fibroadenoma.  Refill authorized: 02/10/16 #[id number] 0R. Please advise. Thank you.   ~Patient states the increased dosage is doing well. Patient would like to continue this dose.

## 2016-04-06 ENCOUNTER — Ambulatory Visit (HOSPITAL_BASED_OUTPATIENT_CLINIC_OR_DEPARTMENT_OTHER): Payer: 59 | Admitting: Oncology

## 2016-04-06 VITALS — BP 160/99 | HR 96 | Temp 98.6°F | Resp 18 | Ht 64.5 in | Wt 161.7 lb

## 2016-04-06 DIAGNOSIS — N951 Menopausal and female climacteric states: Secondary | ICD-10-CM | POA: Diagnosis not present

## 2016-04-06 DIAGNOSIS — F39 Unspecified mood [affective] disorder: Secondary | ICD-10-CM

## 2016-04-06 DIAGNOSIS — N6091 Unspecified benign mammary dysplasia of right breast: Secondary | ICD-10-CM

## 2016-04-06 MED FILL — VENLAFAXINE HCL ER 75 MG CA: 75 | 30 days supply | Qty: 30 | Fill #1

## 2016-04-06 NOTE — Progress Notes (Signed)
[facility]  Telephone:([phone]) [phone] Fax:(336) [phone]     ID: Alicia Alexander DOB: 1975-07-28  MR#: [id number]  TKZ#:[id number]  Patient Care Team: No Pcp Per Patient as PCP - General (General Practice) Chauncey Cruel, MD as Consulting Physician (Oncology) Excell Seltzer, MD as Consulting Physician (General Surgery) Nunzio Cobbs, MD as Consulting Physician (Obstetrics and Gynecology) Carol Ada, MD as Consulting Physician (Gastroenterology) Chauncey Cruel, MD OTHER MD:  CHIEF COMPLAINT: Atypical lobular hyperplasia  CURRENT TREATMENT: Tamoxifen    HISTORY OF PRESENT ILLNESS:   Alicia Alexander had screening mammography 07/26/2014 showing a possible mass in the right breast. She was referred to the breast Center where on 07/27/2014 she underwent right diagnostic mammography with tomosynthesis and right breast ultrasonography. The breast density was described as category C. Spot compression views showed a low-density mass in the upper inner right breast which was not palpable. Ultrasound confirmed a 0.6 cm hypoechoic oval palatal mass at the 1:00 position of the right breast 10 cm from the nipple. This was felt to be likely benign and six-month follow-up was recommended.  On 01/31/2015 the patient had repeat right diagnostic mammography with tomosynthesis and right breast ultrasonography. The mass in the upper inner quadrant of the right breast was unchanged by mammography and ultrasonography. There was a possible new circumscribed mass in the lower right breast which on ultrasound measured 0.8 cm at the 8:00 position 7 cm from the nipple. This was also felt to be likely benign. Six-month follow-up was again recommended.  On 08/01/2015 the patient underwent bilateral diagnostic mammography with tomography and right breast ultrasonography. This time the breast density was read as category D. The left breast was unremarkable. The breast mass at 1:00 10 cm  from the nipple was unchanged. The mass at 8:00 7 cm from the nipple had grown slightly to 1.0 cm. Now there were in addition 2 similar-appearing groups of amorphous calcifications, one measuring 2.9 cm at 11:30 PM and the second 3 cm at 12:30 PM. Both groups showed a linear distribution. The right axilla ultrasound was negative.  On 08/06/2015 the patient underwent biopsy of the 2 breast masses in question. The one at 11:30 o'clock showed atypical lobular hyperplasia. The one at the 8:00 position was a fibroadenoma. These results were felt to be concordant([id number].  Accordingly on 10/09/2015 the patient underwent right lumpectomy 2 and the pathology from this procedure ([id number]) showed no evidence of malignancy.  Her subsequent history is as detailed below   INTERVAL HISTORY: Alicia Alexander returns today for follow-up of her atypical lobular hyperplasia. She was started on tamoxifen November 2017. She is tolerating it remarkably well. She does have some hot flashes and some night sweats. At the same time she is aware that she is in the perimenopausal transition and that problems like hot flashes, insomnia, weight gain, moodiness, and vaginal dryness are very common.  She is taking venlafaxine both for the hot flashes and the moodiness and it is working well. She describes herself as much less irritable and much more stable emotionally. She is actually losing a little weight, which she is trying to do and that is a major achievement in this setting. She is exercising chiefly by walking and she walks up to 3 miles up to 4 times a day.  REVIEW OF SYSTEMS: She had noted some changes in her right breast which she wanted me to evaluate today. A detailed review of systems today was otherwise noncontributory  PAST MEDICAL HISTORY:  Past Medical History:  Diagnosis Date  . Abnormal Pap smear of cervix 2001   --ascus  . Anemia   . Blood transfusion without reported diagnosis 01-31-00  . Gestational  diabetes   . Headache    HX MIGRAINES   . Hyperthyroidism    W/ PREGNANCY  . Pregnancy induced hypertension   . Thyroid disease    only with pregnancy  . Vitamin D deficiency     PAST SURGICAL HISTORY: Past Surgical History:  Procedure Laterality Date  . ABDOMINAL HYSTERECTOMY  10-11-07   TVH--ovaries remain     (LAPAROSCOPIC)  . BREAST LUMPECTOMY WITH RADIOACTIVE SEED LOCALIZATION Right 10/09/2015   Procedure: RIGHT BREAST LUMPECTOMY X 2 WITH (1) RADIOACTIVE SEED AND (1) WIRE LOCALIZATION;  Surgeon: Excell Seltzer, MD;  Location: [facility];  Service: General;  Laterality: Right;  . BREAST SURGERY  07/2015   3 benign breast biopsies.  Atypical hyperplasia.  Marland Kitchen CESAREAN SECTION  01/2000, 05/2005  . CHOLECYSTECTOMY N/A 06/13/2015   Procedure: LAPAROSCOPIC CHOLECYSTECTOMY;  Surgeon: Greer Pickerel, MD;  Location: [facility];  Service: General;  Laterality: N/A;  . COLPOSCOPY  2001  . WISDOM TOOTH EXTRACTION  1995    FAMILY HISTORY Family History  Problem Relation Age of Onset  . COPD Mother   . Emphysema Mother   . Thyroid disease Mother     hypothyroid  . Atrial fibrillation Mother   . Osteopenia Mother   . Cancer Paternal Grandfather     dec--pancreatic ca  . Diabetes Father     AODM  . Heart attack Father   . Breast cancer Maternal Aunt   . Lupus Maternal Aunt   The patient's parents are in their mid 74s as of November 2017. The patient has one brother, no sisters. A maternal aunt was diagnosed with breast cancer at age 31. A paternal grandfather was diagnosed with pancreatic cancer at age 40 and a maternal great grandmother had pancreatic cancer at age 38. There is no other history of breast or ovarian cancer in the family to the patient's knowledge  GYNECOLOGIC HISTORY:  No LMP recorded. Patient has had a hysterectomy. Menarche age 57, first live birth age 64, the patient is Alicia Alexander P2. She stopped having periods in September 2009. 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 She is married. Her husband Patrick Jupiter is an Psychologist, educational. Their children are Alicia Alexander and Alicia Alexander, aged 28 and 10 as of November 2017.    ADVANCED DIRECTIVES: Not in place   HEALTH MAINTENANCE: Social History  Substance Use Topics  . Smoking status: Never Smoker  . Smokeless tobacco: Never Used  . Alcohol use 0.0 oz/week     Comment: occ.     Colonoscopy:  PAP:  Bone density:   Allergies  Allergen Reactions  . Zofran [Ondansetron Hcl] Hives    Current Outpatient Prescriptions  Medication Sig Dispense Refill  . loratadine (CLARITIN) 10 MG tablet Take 10 mg by mouth daily as needed for allergies or rhinitis.     Marland Kitchen tamoxifen (NOLVADEX) 20 MG tablet Take 20 mg by mouth daily.    Marland Kitchen venlafaxine XR (EFFEXOR-XR) 75 MG 24 hr capsule Take 1 capsule (75 mg total) by mouth daily. 30 capsule 8  . Vitamin D, Ergocalciferol, (DRISDOL) 50000 units CAPS capsule Take 1 capsule (50,000 Units total)  by mouth every 14 (fourteen) days. 6 capsule 0   No current facility-administered medications for this visit.     OBJECTIVE: Young white woman In no acute distress  Vitals:   04/06/16 1540  BP: (!) 160/99  Pulse: 96  Resp: 18  Temp: 98.6 F (37 C)     Body mass index is 27.33 kg/m.    ECOG FS:0 - Asymptomatic  Sclerae unicteric, pupils round and equal Oropharynx clear and moist No cervical or supraclavicular adenopathy Lungs no rales or rhonchi Heart regular rate and rhythm Abd soft, nontender, positive bowel sounds MSK no focal spinal tenderness, no upper extremity lymphedema Neuro: nonfocal, well oriented, appropriate affect Breasts: The right breast is status post prior lumpectomy. Because a little bit of tissue has been removed the nipple rides a little lower than the opposite breast. Also there is a  very minimal heart to notice fold in the underside of the breast, which the patient pointed out to me. This is entirely benign. The left breast is unremarkable. Both axillae are benign.  LAB RESULTS:  CMP     Component Value Date/Time   NA 136 11/12/2015 1329   K 4.4 11/12/2015 1329   CL 102 11/12/2015 1329   CO2 24 11/12/2015 1329   GLUCOSE 77 11/12/2015 1329   BUN 9 11/12/2015 1329   CREATININE 0.55 11/12/2015 1329   CALCIUM 9.1 11/12/2015 1329   PROT 7.3 11/12/2015 1329   ALBUMIN 4.0 11/12/2015 1329   AST 14 11/12/2015 1329   ALT 19 11/12/2015 1329   ALKPHOS 68 11/12/2015 1329   BILITOT 0.4 11/12/2015 1329   GFRNONAA >60 06/06/2015 1131   GFRAA >60 06/06/2015 1131    INo results found for: SPEP, UPEP  Lab Results  Component Value Date   WBC 6.0 11/12/2015   HGB 13.8 11/12/2015   HCT 43.2 11/12/2015   MCV 88.7 11/12/2015   PLT 349 11/12/2015      Chemistry      Component Value Date/Time   NA 136 11/12/2015 1329   K 4.4 11/12/2015 1329   CL 102 11/12/2015 1329   CO2 24 11/12/2015 1329   BUN 9 11/12/2015 1329   CREATININE 0.55 11/12/2015 1329      Component Value Date/Time   CALCIUM 9.1 11/12/2015 1329   ALKPHOS 68 11/12/2015 1329   AST 14 11/12/2015 1329   ALT 19 11/12/2015 1329   BILITOT 0.4 11/12/2015 1329       No results found for: LABCA2  No components found for: LABCA125  No results for input(s): INR in the last 168 hours.  Urinalysis    Component Value Date/Time   BILIRUBINUR n 11/14/2015 1420   PROTEINUR n 11/14/2015 1420   UROBILINOGEN negative 11/14/2015 1420   NITRITE n 11/14/2015 1420   LEUKOCYTESUR Trace (A) 11/14/2015 1420     STUDIES: CLINICAL DATA:  The patient had a mass biopsied in the right breast at 8 o'clock which was a fibroadenoma. A group of calcifications at 12:30 were fibrocystic changes. A group of calcifications at 11:30 was atypia. Both groups of calcifications were removed. The patient also had a mass at 1  o'clock which was being followed. This is a follow-up for the 1 o'clock mass.  EXAM: 2D DIGITAL DIAGNOSTIC RIGHT MAMMOGRAM WITH CAD AND ADJUNCT TOMO  ULTRASOUND RIGHT BREAST  COMPARISON:  Previous exam(s).  ACR Breast Density Category c: The breast tissue is heterogeneously dense, which may obscure small masses.  FINDINGS: The mass at 1  o'clock in the right breast appears to have been removed when the calcifications at 12:30 were removed. No suspicious interval changes.  Mammographic images were processed with CAD.  On physical exam, no suspicious lumps are identified.  Targeted ultrasound is performed, showing absence of the 1 o'clock mass which was previously followed.  IMPRESSION: No mammographic or sonographic evidence of malignancy. Annual screening mammography next due in July 2018.  RECOMMENDATION: Annual screening mammography next due in July 2018.  I have discussed the findings and recommendations with the patient. Results were also provided in writing at the conclusion of the visit. If applicable, a reminder letter will be sent to the patient regarding the next appointment.  BI-RADS CATEGORY  2: Benign.   Electronically Signed   By: Dorise Bullion III M.D   On: 01/02/2016 15:04  ELIGIBLE FOR AVAILABLE RESEARCH PROTOCOL: no  ASSESSMENT: 41 y.o. [place] woman with a history of atypical lobular hyperplasia biopsied from the right breast 08/06/2015  (1) biopsy of a second area in the right breast 08/08/2015 showed only fibrocystic change  (2) right lumpectomy 10/11/2015 showed no evidence of malignancy or further atypical hyperplasia  (3) tamoxifen started November 2017,  for prevention.  (a) status post simple hysterectomy, no salpingo-oophorectomy  PLAN: Alicia Alexander is tolerating tamoxifen well and the plan will be to continue that for a total of 5 years. The goal is to decrease her risk of breast cancer developing an by doing this risk  will decrease by one half.  She is very asked acute to know that some of the symptoms she is experiencing are really due to perimenopausal not to tamoxifen. Nevertheless there is overlap. Even with that though I don't see any need for any further interventions in terms of symptom management. The venlafaxine is working well for hot flashes and for mood. If sleep becomes more of a problem gabapentin 300 mg at bedtime could be added  At this point I'm comfortable releasing her to her gynecologist's care. Ideally in addition to her yearly mammography she would have biannual breast exams. After 5 years of tamoxifen can be discontinued and the benefits persist. We do not have data for continuing tamoxifen beyond 5 years in this setting  I will be glad to see Alicia Alexander again at any point in the future if on when the need arises, but as of now are making no further routine appointment for her Chauncey Cruel, MD   04/06/2016 4:28 PM Medical Oncology and Hematology [facility] [place], [place] [address] Tel. [phone]    Fax. [phone]

## 2016-05-07 MED FILL — VENLAFAXINE HCL ER 75 MG CA: 75 | 30 days supply | Qty: 30 | Fill #2

## 2016-06-02 MED FILL — TAMOXIFEN 20 MG TABLET: 20 | 90 days supply | Qty: 90 | Fill #2

## 2016-06-02 MED FILL — VENLAFAXINE HCL ER 75 MG CA: 75 | 30 days supply | Qty: 30 | Fill #3

## 2016-06-05 ENCOUNTER — Other Ambulatory Visit: Payer: Self-pay | Admitting: Obstetrics and Gynecology

## 2016-06-05 DIAGNOSIS — Z1231 Encounter for screening mammogram for malignant neoplasm of breast: Secondary | ICD-10-CM

## 2016-07-09 MED FILL — VENLAFAXINE HCL ER 75 MG CA: 75 | 30 days supply | Qty: 30 | Fill #4

## 2016-07-27 ENCOUNTER — Telehealth: Payer: Self-pay

## 2016-07-27 ENCOUNTER — Other Ambulatory Visit: Payer: Self-pay | Admitting: Obstetrics and Gynecology

## 2016-07-27 MED ORDER — VENLAFAXINE HCL ER 150 MG PO CP24: 150.0000 mg | ORAL_CAPSULE | Freq: Every day | ORAL | 1 refills | Status: DC

## 2016-07-27 MED FILL — VENLAFAXINE HCL ER 150 MG C: 150 | 90 days supply | Qty: 90 | Fill #0

## 2016-07-27 NOTE — Telephone Encounter (Signed)
Patient requests an increase in Effexor. Per patient, states that hot flashes and night sweats have increased; unable to sleep. Per patient no change in mood. Please advise

## 2016-07-27 NOTE — Telephone Encounter (Signed)
Discussed with patient option to increase Effexor to 150 mg.  Patient would like to do a trial of this.  I have already sent this in to her pharmacy.  #90, RF one.   You may close the encounter.

## 2016-08-06 ENCOUNTER — Ambulatory Visit: Payer: 59

## 2016-08-06 ENCOUNTER — Ambulatory Visit
Admission: RE | Admit: 2016-08-06 | Discharge: 2016-08-06 | Disposition: A | Discharge status: Home / Self Care | Payer: 59 | Source: Ambulatory Visit | Attending: Obstetrics and Gynecology | Admitting: Obstetrics and Gynecology

## 2016-08-06 DIAGNOSIS — Z1231 Encounter for screening mammogram for malignant neoplasm of breast: Secondary | ICD-10-CM | POA: Diagnosis not present

## 2016-08-31 MED FILL — TAMOXIFEN 20 MG TABLET: 20 | 90 days supply | Qty: 90 | Fill #3

## 2016-11-06 MED FILL — VENLAFAXINE HCL ER 150 MG C: 150 | 90 days supply | Qty: 90 | Fill #1

## 2016-11-20 MED FILL — TAMOXIFEN CITRATE 20 MG TAB: 20 | 90 days supply | Qty: 90 | Fill #4

## 2016-11-26 ENCOUNTER — Ambulatory Visit: Payer: 59 | Admitting: Obstetrics and Gynecology

## 2017-02-10 MED FILL — VENLAFAXINE HCL ER 75 MG CA: 75 | 30 days supply | Qty: 30 | Fill #5

## 2017-02-26 ENCOUNTER — Other Ambulatory Visit: Payer: Self-pay

## 2017-02-26 MED ORDER — VENLAFAXINE HCL ER 150 MG PO CP24: 150.0000 mg | ORAL_CAPSULE | Freq: Every day | ORAL | 1 refills | Status: DC

## 2017-02-26 NOTE — Telephone Encounter (Signed)
Patient called requesting refill on Effexor 150mg  until she sees PCP 04-02-17.  Medication refill request: Effexor 150mg  #30 Last AEX:  11-14-15 Next AEX: none Last MMG (if hormonal medication request):08-06-16 [id number] Refill authorized: Please advise

## 2017-04-02 ENCOUNTER — Encounter: Payer: Self-pay | Admitting: Obstetrics & Gynecology

## 2017-04-02 ENCOUNTER — Ambulatory Visit (INDEPENDENT_AMBULATORY_CARE_PROVIDER_SITE_OTHER): Payer: BLUE CROSS/BLUE SHIELD | Admitting: Obstetrics & Gynecology

## 2017-04-02 VITALS — BP 150/90 | Ht 65.0 in | Wt 183.0 lb

## 2017-04-02 DIAGNOSIS — N6091 Unspecified benign mammary dysplasia of right breast: Secondary | ICD-10-CM | POA: Diagnosis not present

## 2017-04-02 DIAGNOSIS — N951 Menopausal and female climacteric states: Secondary | ICD-10-CM | POA: Diagnosis not present

## 2017-04-02 DIAGNOSIS — Z90710 Acquired absence of both cervix and uterus: Secondary | ICD-10-CM

## 2017-04-02 DIAGNOSIS — Z01411 Encounter for gynecological examination (general) (routine) with abnormal findings: Secondary | ICD-10-CM | POA: Diagnosis not present

## 2017-04-02 MED ORDER — TAMOXIFEN CITRATE 20 MG PO TABS: 20.0000 mg | ORAL_TABLET | Freq: Every day | ORAL | 4 refills | Status: DC

## 2017-04-02 MED ORDER — VENLAFAXINE HCL ER 150 MG PO CP24: 150.0000 mg | ORAL_CAPSULE | Freq: Every day | ORAL | 5 refills | Status: DC

## 2017-04-02 NOTE — Progress Notes (Signed)
Alicia Alexander [birth year] [id number]   History:    42 y.o. G3P2A1L2  Married.  RP:  New patient presenting for annual gyn exam   HPI:  S/P Hysterectomy.  No pelvic pain.  No pain with IC.  Dx of Rt breast atypical lobular hyperplasia for which she was started on tamoxifen November 2017 to reduce the risk of Breast Ca.  Last mammo negative 07/2016.  Hot flushes much improved on Effexor as well as her mood.  Walking regularly. BMI 30.45.  Urine/BMs wnl.  Health labs with Fam MD.  Past medical history,surgical history, family history and social history were all reviewed and documented in the EPIC chart.  Gynecologic History No LMP recorded. Patient has had a hysterectomy. Contraception: status post hysterectomy Last Pap: 2015. Results were: Negative Last mammogram: 07/2016. Results were: Negative Bone Density: Never Colonoscopy: Never  Obstetric History OB History  Gravida Para Term Preterm AB Living  3 2 2   1 2   SAB TAB Ectopic Multiple Live Births  1            # Outcome Date GA Lbr Len/2nd Weight Sex Delivery Anes PTL Lv  3 SAB           2 Term           1 Term              ROS: A ROS was performed and pertinent positives and negatives are included in the history.  GENERAL: No fevers or chills. HEENT: No change in vision, no earache, sore throat or sinus congestion. NECK: No pain or stiffness. CARDIOVASCULAR: No chest pain or pressure. No palpitations. PULMONARY: No shortness of breath, cough or wheeze. GASTROINTESTINAL: No abdominal pain, nausea, vomiting or diarrhea, melena or bright red blood per rectum. GENITOURINARY: No urinary frequency, urgency, hesitancy or dysuria. MUSCULOSKELETAL: No joint or muscle pain, no back pain, no recent trauma. DERMATOLOGIC: No rash, no itching, no lesions. ENDOCRINE: No polyuria, polydipsia, no heat or cold intolerance. No recent change in weight. HEMATOLOGICAL: No anemia or easy bruising or bleeding. NEUROLOGIC: No headache, seizures,  numbness, tingling or weakness. PSYCHIATRIC: No depression, no loss of interest in normal activity or change in sleep pattern.     Exam:   Ht 5\' 5"  (1.651 m)   Wt 183 lb (83 kg)   BMI 30.45 kg/m   Body mass index is 30.45 kg/m.  General appearance : Well developed well nourished female. No acute distress HEENT: Eyes: no retinal hemorrhage or exudates,  Neck supple, trachea midline, no carotid bruits, no thyroidmegaly Lungs: Clear to auscultation, no rhonchi or wheezes, or rib retractions  Heart: Regular rate and rhythm, no murmurs or gallops Breast:Examined in sitting and supine position were symmetrical in appearance, no palpable masses or tenderness,  no skin retraction, no nipple inversion, no nipple discharge, no skin discoloration, no axillary or supraclavicular lymphadenopathy Abdomen: no palpable masses or tenderness, no rebound or guarding Extremities: no edema or skin discoloration or tenderness  Pelvic: Vulva: Normal             Vagina: No gross lesions or discharge  Cervix/Uterus  Adnexa  Without masses or tenderness  Anus: Normal   Assessment/Plan:  42 y.o. female for annual exam   1. Encounter for gynecological examination with abnormal finding Gynecologic exam status post hysterectomy.  Pap test negative in 2015.  Will repeat Pap test next year.  Breast exam normal.  Will repeat screening mammogram in July 2019.  Health labs with family physician.  2. History of total hysterectomy  3. Perimenopause Symptoms of menopause well controlled on Effexor-XR 150 mg daily.  Will continue with same regimen.  Prescription sent to pharmacy.  4. Atypical lobular hyperplasia of right breast Diagnosis of right breast atypical lobular hyperplasia in November 2017.  Started on tamoxifen since diagnosis to reduce the risk of breast cancer.  Other orders - tamoxifen (NOLVADEX) 20 MG tablet; Take 1 tablet (20 mg total) by mouth daily. - venlafaxine XR (EFFEXOR-XR) 150 MG 24 hr  capsule; Take 1 capsule (150 mg total) by mouth daily.  Counseling on above issues and coordination of care more than 50% for 10 minutes.  Princess Bruins MD, 2:45 PM 04/02/2017

## 2017-04-02 NOTE — Patient Instructions (Signed)
1. Encounter for gynecological examination with abnormal finding Gynecologic exam status post hysterectomy.  Pap test negative in 2015.  Will repeat Pap test next year.  Breast exam normal.  Will repeat screening mammogram in July 2019.  Health labs with family physician.  2. History of total hysterectomy  3. Perimenopause Symptoms of menopause well controlled on Effexor-XR 150 mg daily.  Will continue with same regimen.  Prescription sent to pharmacy.  4. Atypical lobular hyperplasia of right breast Diagnosis of right breast atypical lobular hyperplasia in November 2017.  Started on tamoxifen since diagnosis to reduce the risk of breast cancer.  Other orders - tamoxifen (NOLVADEX) 20 MG tablet; Take 1 tablet (20 mg total) by mouth daily. - venlafaxine XR (EFFEXOR-XR) 150 MG 24 hr capsule; Take 1 capsule (150 mg total) by mouth daily.  Alicia Alexander, it was a pleasure seeing you today!   Health Maintenance for Postmenopausal Women Menopause is a normal process in which your reproductive ability comes to an end. This process happens gradually over a span of months to years, usually between the ages of 73 and 73. Menopause is complete when you have missed 12 consecutive menstrual periods. It is important to talk with your health care provider about some of the most common conditions that affect postmenopausal women, such as heart disease, cancer, and bone loss (osteoporosis). Adopting a healthy lifestyle and getting preventive care can help to promote your health and wellness. Those actions can also lower your chances of developing some of these common conditions. What should I know about menopause? During menopause, you may experience a number of symptoms, such as:  Moderate-to-severe hot flashes.  Night sweats.  Decrease in sex drive.  Mood swings.  Headaches.  Tiredness.  Irritability.  Memory problems.  Insomnia.  Choosing to treat or not to treat menopausal changes is an  individual decision that you make with your health care provider. What should I know about hormone replacement therapy and supplements? Hormone therapy products are effective for treating symptoms that are associated with menopause, such as hot flashes and night sweats. Hormone replacement carries certain risks, especially as you become older. If you are thinking about using estrogen or estrogen with progestin treatments, discuss the benefits and risks with your health care provider. What should I know about heart disease and stroke? Heart disease, heart attack, and stroke become more likely as you age. This may be due, in part, to the hormonal changes that your body experiences during menopause. These can affect how your body processes dietary fats, triglycerides, and cholesterol. Heart attack and stroke are both medical emergencies. There are many things that you can do to help prevent heart disease and stroke:  Have your blood pressure checked at least every 1-2 years. High blood pressure causes heart disease and increases the risk of stroke.  If you are 54-56 years old, ask your health care provider if you should take aspirin to prevent a heart attack or a stroke.  Do not use any tobacco products, including cigarettes, chewing tobacco, or electronic cigarettes. If you need help quitting, ask your health care provider.  It is important to eat a healthy diet and maintain a healthy weight. ? Be sure to include plenty of vegetables, fruits, low-fat dairy products, and lean protein. ? Avoid eating foods that are high in solid fats, added sugars, or salt (sodium).  Get regular exercise. This is one of the most important things that you can do for your health. ? Try to exercise  for at least 150 minutes each week. The type of exercise that you do should increase your heart rate and make you sweat. This is known as moderate-intensity exercise. ? Try to do strengthening exercises at least twice each  week. Do these in addition to the moderate-intensity exercise.  Know your numbers.Ask your health care provider to check your cholesterol and your blood glucose. Continue to have your blood tested as directed by your health care provider.  What should I know about cancer screening? There are several types of cancer. Take the following steps to reduce your risk and to catch any cancer development as early as possible. Breast Cancer  Practice breast self-awareness. ? This means understanding how your breasts normally appear and feel. ? It also means doing regular breast self-exams. Let your health care provider know about any changes, no matter how small.  If you are 16 or older, have a clinician do a breast exam (clinical breast exam or CBE) every year. Depending on your age, family history, and medical history, it may be recommended that you also have a yearly breast X-ray (mammogram).  If you have a family history of breast cancer, talk with your health care provider about genetic screening.  If you are at high risk for breast cancer, talk with your health care provider about having an MRI and a mammogram every year.  Breast cancer (BRCA) gene test is recommended for women who have family members with BRCA-related cancers. Results of the assessment will determine the need for genetic counseling and BRCA1 and for BRCA2 testing. BRCA-related cancers include these types: ? Breast. This occurs in males or females. ? Ovarian. ? Tubal. This may also be called fallopian tube cancer. ? Cancer of the abdominal or pelvic lining (peritoneal cancer). ? Prostate. ? Pancreatic.  Cervical, Uterine, and Ovarian Cancer Your health care provider may recommend that you be screened regularly for cancer of the pelvic organs. These include your ovaries, uterus, and vagina. This screening involves a pelvic exam, which includes checking for microscopic changes to the surface of your cervix (Pap test).  For  women ages 21-65, health care providers may recommend a pelvic exam and a Pap test every three years. For women ages 67-65, they may recommend the Pap test and pelvic exam, combined with testing for human papilloma virus (HPV), every five years. Some types of HPV increase your risk of cervical cancer. Testing for HPV may also be done on women of any age who have unclear Pap test results.  Other health care providers may not recommend any screening for nonpregnant women who are considered low risk for pelvic cancer and have no symptoms. Ask your health care provider if a screening pelvic exam is right for you.  If you have had past treatment for cervical cancer or a condition that could lead to cancer, you need Pap tests and screening for cancer for at least 20 years after your treatment. If Pap tests have been discontinued for you, your risk factors (such as having a new sexual partner) need to be reassessed to determine if you should start having screenings again. Some women have medical problems that increase the chance of getting cervical cancer. In these cases, your health care provider may recommend that you have screening and Pap tests more often.  If you have a family history of uterine cancer or ovarian cancer, talk with your health care provider about genetic screening.  If you have vaginal bleeding after reaching menopause, tell your health  care provider.  There are currently no reliable tests available to screen for ovarian cancer.  Lung Cancer Lung cancer screening is recommended for adults 57-49 years old who are at high risk for lung cancer because of a history of smoking. A yearly low-dose CT scan of the lungs is recommended if you:  Currently smoke.  Have a history of at least 30 pack-years of smoking and you currently smoke or have quit within the past 15 years. A pack-year is smoking an average of one pack of cigarettes per day for one year.  Yearly screening should:  Continue  until it has been 15 years since you quit.  Stop if you develop a health problem that would prevent you from having lung cancer treatment.  Colorectal Cancer  This type of cancer can be detected and can often be prevented.  Routine colorectal cancer screening usually begins at age 47 and continues through age 85.  If you have risk factors for colon cancer, your health care provider may recommend that you be screened at an earlier age.  If you have a family history of colorectal cancer, talk with your health care provider about genetic screening.  Your health care provider may also recommend using home test kits to check for hidden blood in your stool.  A small camera at the end of a tube can be used to examine your colon directly (sigmoidoscopy or colonoscopy). This is done to check for the earliest forms of colorectal cancer.  Direct examination of the colon should be repeated every 5-10 years until age 47. However, if early forms of precancerous polyps or small growths are found or if you have a family history or genetic risk for colorectal cancer, you may need to be screened more often.  Skin Cancer  Check your skin from head to toe regularly.  Monitor any moles. Be sure to tell your health care provider: ? About any new moles or changes in moles, especially if there is a change in a mole's shape or color. ? If you have a mole that is larger than the size of a pencil eraser.  If any of your family members has a history of skin cancer, especially at a young age, talk with your health care provider about genetic screening.  Always use sunscreen. Apply sunscreen liberally and repeatedly throughout the day.  Whenever you are outside, protect yourself by wearing long sleeves, pants, a wide-brimmed hat, and sunglasses.  What should I know about osteoporosis? Osteoporosis is a condition in which bone destruction happens more quickly than new bone creation. After menopause, you may be  at an increased risk for osteoporosis. To help prevent osteoporosis or the bone fractures that can happen because of osteoporosis, the following is recommended:  If you are 18-47 years old, get at least 1,000 mg of calcium and at least 600 mg of vitamin D per day.  If you are older than age 55 but younger than age 34, get at least 1,200 mg of calcium and at least 600 mg of vitamin D per day.  If you are older than age 15, get at least 1,200 mg of calcium and at least 800 mg of vitamin D per day.  Smoking and excessive alcohol intake increase the risk of osteoporosis. Eat foods that are rich in calcium and vitamin D, and do weight-bearing exercises several times each week as directed by your health care provider. What should I know about how menopause affects my mental health? Depression may  occur at any age, but it is more common as you become older. Common symptoms of depression include:  Low or sad mood.  Changes in sleep patterns.  Changes in appetite or eating patterns.  Feeling an overall lack of motivation or enjoyment of activities that you previously enjoyed.  Frequent crying spells.  Talk with your health care provider if you think that you are experiencing depression. What should I know about immunizations? It is important that you get and maintain your immunizations. These include:  Tetanus, diphtheria, and pertussis (Tdap) booster vaccine.  Influenza every year before the flu season begins.  Pneumonia vaccine.  Shingles vaccine.  Your health care provider may also recommend other immunizations. This information is not intended to replace advice given to you by your health care provider. Make sure you discuss any questions you have with your health care provider. Document Released: 02/20/2005 Document Revised: 07/19/2015 Document Reviewed: 10/02/2014 Elsevier Interactive Patient Education  2018 [employer].

## 2017-09-03 ENCOUNTER — Other Ambulatory Visit: Payer: Self-pay | Admitting: Obstetrics & Gynecology

## 2017-09-03 DIAGNOSIS — Z1231 Encounter for screening mammogram for malignant neoplasm of breast: Secondary | ICD-10-CM

## 2017-09-29 ENCOUNTER — Ambulatory Visit
Admission: RE | Admit: 2017-09-29 | Discharge: 2017-09-29 | Disposition: A | Discharge status: Home / Self Care | Payer: BLUE CROSS/BLUE SHIELD | Source: Ambulatory Visit | Attending: Obstetrics & Gynecology | Admitting: Obstetrics & Gynecology

## 2017-09-29 DIAGNOSIS — Z1231 Encounter for screening mammogram for malignant neoplasm of breast: Secondary | ICD-10-CM

## 2017-10-26 ENCOUNTER — Other Ambulatory Visit: Payer: Self-pay | Admitting: Obstetrics & Gynecology

## 2018-04-04 ENCOUNTER — Other Ambulatory Visit: Payer: Self-pay

## 2018-04-06 ENCOUNTER — Encounter: Payer: BLUE CROSS/BLUE SHIELD | Admitting: Obstetrics & Gynecology

## 2018-04-06 ENCOUNTER — Ambulatory Visit (INDEPENDENT_AMBULATORY_CARE_PROVIDER_SITE_OTHER): Payer: BLUE CROSS/BLUE SHIELD | Admitting: Obstetrics & Gynecology

## 2018-04-06 ENCOUNTER — Other Ambulatory Visit: Payer: Self-pay

## 2018-04-06 ENCOUNTER — Encounter: Payer: Self-pay | Admitting: Obstetrics & Gynecology

## 2018-04-06 VITALS — BP 150/94 | Ht 64.5 in | Wt 181.0 lb

## 2018-04-06 DIAGNOSIS — N6091 Unspecified benign mammary dysplasia of right breast: Secondary | ICD-10-CM

## 2018-04-06 DIAGNOSIS — Z90710 Acquired absence of both cervix and uterus: Secondary | ICD-10-CM | POA: Diagnosis not present

## 2018-04-06 DIAGNOSIS — Z6830 Body mass index (BMI) 30.0-30.9, adult: Secondary | ICD-10-CM

## 2018-04-06 DIAGNOSIS — N951 Menopausal and female climacteric states: Secondary | ICD-10-CM

## 2018-04-06 DIAGNOSIS — Z01419 Encounter for gynecological examination (general) (routine) without abnormal findings: Secondary | ICD-10-CM | POA: Diagnosis not present

## 2018-04-06 DIAGNOSIS — Z1272 Encounter for screening for malignant neoplasm of vagina: Secondary | ICD-10-CM

## 2018-04-06 DIAGNOSIS — E6609 Other obesity due to excess calories: Secondary | ICD-10-CM

## 2018-04-06 MED ORDER — VENLAFAXINE HCL ER 75 MG PO CP24: 75.0000 mg | ORAL_CAPSULE | Freq: Every day | ORAL | 4 refills | Status: DC

## 2018-04-06 NOTE — Patient Instructions (Signed)
1. Encounter for Papanicolaou smear of vagina as part of routine gynecological examination Gynecologic exam s/p Total Hysterectomy.  Pap reflex done on vaginal vault.  Breasts normal.  Last screening mammo negative 09/2017.  Health Labs with Fam MD.  2. S/P total hysterectomy  3. Atypical lobular hyperplasia of right breast On Tamoxifen for Breast Ca prevention.  4. Menopausal syndrome Menopausal Sxs well controled on Effexor XR 150 mg daily, but low libido.  Will decrease Effexor XR to 75 mg daily. Prescription sent to pharmacy.  Recommend Vit D supplements, Ca++ total intake of 1.5 g/day, regular weightbearing physical activity.  5. Class 1 obesity due to excess calories without serious comorbidity with body mass index (BMI) of 30.0 to 30.9 in adult Recommend a lower calorie/carb diet such as [employer].  Aerobic physical acitivties 5x per week, weightlifting every 2 days.  Other orders - venlafaxine XR (EFFEXOR-XR) 75 MG 24 hr capsule; Take 1 capsule (75 mg total) by mouth daily.  Alicia Alexander, it was a pleasure seeing you today!  I will inform you of your results as soon as they are available.

## 2018-04-06 NOTE — Progress Notes (Signed)
Alicia Alexander Apr 09, 1975 [id number]   History:    43 y.o. G3P2A1L2 Married  RP:  Established patient presenting for annual gyn exam   HPI: S/P Total Hysterectomy.  No pelvic pain.  No pain with IC.  Dx of Rt breast atypical lobular hyperplasia for which she was started on tamoxifen November 2017 to reduce the risk of Breast Ca.  Last mammo negative 09/2017.  Hot flushes much improved on Effexor as well as her mood, but low libido, would like to decrease the dosage.  Walking regularly. BMI 30.59.  Urine/BMs wnl.  Health labs with Fam MD.  Past medical history,surgical history, family history and social history were all reviewed and documented in the EPIC chart.  Gynecologic History No LMP recorded. Patient has had a hysterectomy. Contraception: status post hysterectomy Last Pap: 2015. Results were: Negative Last mammogram: 09/2017. Results were: Negative Bone Density: Never Colonoscopy: Never  Obstetric History OB History  Gravida Para Term Preterm AB Living  3 2 2   1 2   SAB TAB Ectopic Multiple Live Births  1            # Outcome Date GA Lbr Len/2nd Weight Sex Delivery Anes PTL Lv  3 SAB           2 Term           1 Term              ROS: A ROS was performed and pertinent positives and negatives are included in the history.  GENERAL: No fevers or chills. HEENT: No change in vision, no earache, sore throat or sinus congestion. NECK: No pain or stiffness. CARDIOVASCULAR: No chest pain or pressure. No palpitations. PULMONARY: No shortness of breath, cough or wheeze. GASTROINTESTINAL: No abdominal pain, nausea, vomiting or diarrhea, melena or bright red blood per rectum. GENITOURINARY: No urinary frequency, urgency, hesitancy or dysuria. MUSCULOSKELETAL: No joint or muscle pain, no back pain, no recent trauma. DERMATOLOGIC: No rash, no itching, no lesions. ENDOCRINE: No polyuria, polydipsia, no heat or cold intolerance. No recent change in weight. HEMATOLOGICAL: No anemia or  easy bruising or bleeding. NEUROLOGIC: No headache, seizures, numbness, tingling or weakness. PSYCHIATRIC: No depression, no loss of interest in normal activity or change in sleep pattern.     Exam:   BP (!) 150/94   Ht 5' 4.5" (1.638 m)   Wt 181 lb (82.1 kg)   BMI 30.59 kg/m   Body mass index is 30.59 kg/m.  General appearance : Well developed well nourished female. No acute distress HEENT: Eyes: no retinal hemorrhage or exudates,  Neck supple, trachea midline, no carotid bruits, no thyroidmegaly Lungs: Clear to auscultation, no rhonchi or wheezes, or rib retractions  Heart: Regular rate and rhythm, no murmurs or gallops Breast:Examined in sitting and supine position were symmetrical in appearance, no palpable masses or tenderness,  no skin retraction, no nipple inversion, no nipple discharge, no skin discoloration, no axillary or supraclavicular lymphadenopathy Abdomen: no palpable masses or tenderness, no rebound or guarding Extremities: no edema or skin discoloration or tenderness  Pelvic: Vulva: Normal             Vagina: No gross lesions or discharge.  Pap reflex done.  Cervix/Uterus absent  Adnexa  Without masses or tenderness  Anus: Normal   Assessment/Plan:  43 y.o. female for annual exam   1. Encounter for Papanicolaou smear of vagina as part of routine gynecological examination Gynecologic exam s/p Total Hysterectomy.  Pap  reflex done on vaginal vault.  Breasts normal.  Last screening mammo negative 09/2017.  Health Labs with Fam MD.  2. S/P total hysterectomy  3. Atypical lobular hyperplasia of right breast On Tamoxifen for Breast Ca prevention.  4. Menopausal syndrome Menopausal Sxs well controled on Effexor XR 150 mg daily, but low libido.  Will decrease Effexor XR to 75 mg daily. Prescription sent to pharmacy.  Recommend Vit D supplements, Ca++ total intake of 1.5 g/day, regular weightbearing physical activity.  5. Class 1 obesity due to excess calories  without serious comorbidity with body mass index (BMI) of 30.0 to 30.9 in adult Recommend a lower calorie/carb diet such as [employer].  Aerobic physical acitivties 5x per week, weightlifting every 2 days.  Other orders - venlafaxine XR (EFFEXOR-XR) 75 MG 24 hr capsule; Take 1 capsule (75 mg total) by mouth daily.  Princess Bruins MD, 8:53 AM 04/06/2018

## 2018-04-07 LAB — PAP IG W/ RFLX HPV ASCU

## 2018-04-11 ENCOUNTER — Encounter: Payer: Self-pay | Admitting: *Deleted

## 2018-11-14 ENCOUNTER — Other Ambulatory Visit: Payer: Self-pay | Admitting: Obstetrics & Gynecology

## 2018-11-14 DIAGNOSIS — Z1231 Encounter for screening mammogram for malignant neoplasm of breast: Secondary | ICD-10-CM

## 2018-11-28 ENCOUNTER — Other Ambulatory Visit: Payer: Self-pay

## 2018-11-28 DIAGNOSIS — Z20822 Contact with and (suspected) exposure to covid-19: Secondary | ICD-10-CM

## 2018-11-30 LAB — NOVEL CORONAVIRUS, NAA: SARS-CoV-2, NAA: NOT DETECTED

## 2018-12-29 ENCOUNTER — Other Ambulatory Visit: Payer: Self-pay

## 2018-12-29 ENCOUNTER — Ambulatory Visit
Admission: RE | Admit: 2018-12-29 | Discharge: 2018-12-29 | Disposition: A | Discharge status: Home / Self Care | Payer: BC Managed Care – PPO | Source: Ambulatory Visit | Attending: Obstetrics & Gynecology | Admitting: Obstetrics & Gynecology

## 2018-12-29 DIAGNOSIS — Z1231 Encounter for screening mammogram for malignant neoplasm of breast: Secondary | ICD-10-CM

## 2019-04-07 ENCOUNTER — Ambulatory Visit (INDEPENDENT_AMBULATORY_CARE_PROVIDER_SITE_OTHER): Payer: BC Managed Care – PPO | Admitting: Obstetrics & Gynecology

## 2019-04-07 ENCOUNTER — Encounter: Payer: Self-pay | Admitting: Obstetrics & Gynecology

## 2019-04-07 ENCOUNTER — Other Ambulatory Visit: Payer: Self-pay

## 2019-04-07 VITALS — BP 140/90 | Ht 64.5 in | Wt 195.0 lb

## 2019-04-07 DIAGNOSIS — N6091 Unspecified benign mammary dysplasia of right breast: Secondary | ICD-10-CM | POA: Diagnosis not present

## 2019-04-07 DIAGNOSIS — Z780 Asymptomatic menopausal state: Secondary | ICD-10-CM

## 2019-04-07 DIAGNOSIS — Z01419 Encounter for gynecological examination (general) (routine) without abnormal findings: Secondary | ICD-10-CM

## 2019-04-07 DIAGNOSIS — Z6832 Body mass index (BMI) 32.0-32.9, adult: Secondary | ICD-10-CM

## 2019-04-07 DIAGNOSIS — Z90710 Acquired absence of both cervix and uterus: Secondary | ICD-10-CM

## 2019-04-07 DIAGNOSIS — E6609 Other obesity due to excess calories: Secondary | ICD-10-CM

## 2019-04-07 NOTE — Progress Notes (Signed)
VERETTA MAGES 1975/06/07 [id number]   History:    44 y.o. [id number] Married  RP:  Established patient presenting for annual gyn exam   HPI: S/P Total Hysterectomy. No pelvic pain. No pain with IC. Dx of Rt breast atypical lobular hyperplasiafor which she was started on tamoxifen November 2017to reduce the risk of Breast Ca.Last mammo negative 12/2018. BMI increased to 32.95.  Walking.  Will start a lower calorie/carb diet. Urine/BMs wnl. Health labs with Fam MD.  Past medical history,surgical history, family history and social history were all reviewed and documented in the EPIC chart.  Gynecologic History No LMP recorded. Patient has had a hysterectomy.  Obstetric History OB History  Gravida Para Term Preterm AB Living  3 2 2   1 2   SAB TAB Ectopic Multiple Live Births  1            # Outcome Date GA Lbr Len/2nd Weight Sex Delivery Anes PTL Lv  3 SAB           2 Term           1 Term              ROS: A ROS was performed and pertinent positives and negatives are included in the history.  GENERAL: No fevers or chills. HEENT: No change in vision, no earache, sore throat or sinus congestion. NECK: No pain or stiffness. CARDIOVASCULAR: No chest pain or pressure. No palpitations. PULMONARY: No shortness of breath, cough or wheeze. GASTROINTESTINAL: No abdominal pain, nausea, vomiting or diarrhea, melena or bright red blood per rectum. GENITOURINARY: No urinary frequency, urgency, hesitancy or dysuria. MUSCULOSKELETAL: No joint or muscle pain, no back pain, no recent trauma. DERMATOLOGIC: No rash, no itching, no lesions. ENDOCRINE: No polyuria, polydipsia, no heat or cold intolerance. No recent change in weight. HEMATOLOGICAL: No anemia or easy bruising or bleeding. NEUROLOGIC: No headache, seizures, numbness, tingling or weakness. PSYCHIATRIC: No depression, no loss of interest in normal activity or change in sleep pattern.     Exam:   BP 140/90   Ht 5' 4.5"  (1.638 m)   Wt 195 lb (88.5 kg)   BMI 32.95 kg/m   Body mass index is 32.95 kg/m.  General appearance : Well developed well nourished female. No acute distress HEENT: Eyes: no retinal hemorrhage or exudates,  Neck supple, trachea midline, no carotid bruits, no thyroidmegaly Lungs: Clear to auscultation, no rhonchi or wheezes, or rib retractions  Heart: Regular rate and rhythm, no murmurs or gallops Breast:Examined in sitting and supine position were symmetrical in appearance, no palpable masses or tenderness,  no skin retraction, no nipple inversion, no nipple discharge, no skin discoloration, no axillary or supraclavicular lymphadenopathy Abdomen: no palpable masses or tenderness, no rebound or guarding Extremities: no edema or skin discoloration or tenderness  Pelvic: Vulva: Normal             Vagina: No gross lesions or discharge  Cervix/Uterus absent  Adnexa  Without masses or tenderness  Anus: Normal   Assessment/Plan:  44 y.o. female for annual exam   1. Well female exam with routine gynecological exam Gynecologic exam status post total hysterectomy.  Pap test March 2020 was negative, no indication to repeat this year.  Breast exam normal.  Screening mammogram December 2020 was negative.  Health labs with family physician.  2. S/P total hysterectomy  3. Postmenopause Well on no hormone replacement therapy.  Vitamin D supplements, calcium intake of 1200 mg daily  and regular weightbearing physical activity is recommended.  4. Atypical lobular hyperplasia of right breast Finished with Tamoxifen.  5. Class 1 obesity due to excess calories without serious comorbidity with body mass index (BMI) of 32.0 to 32.9 in adult Recommend a lower calorie/carb diet such as [employer].  Aerobic activities 5 times a week and light weightlifting every 2 days.  Other orders - omeprazole (PRILOSEC) 40 MG capsule; Take 40 mg by mouth as needed.  Princess Bruins MD, 2:16 PM  04/07/2019

## 2019-04-07 NOTE — Patient Instructions (Signed)
1. Well female exam with routine gynecological exam Gynecologic exam status post total hysterectomy.  Pap test March 2020 was negative, no indication to repeat this year.  Breast exam normal.  Screening mammogram December 2020 was negative.  Health labs with family physician.  2. S/P total hysterectomy  3. Postmenopause Well on no hormone replacement therapy.  Vitamin D supplements, calcium intake of 1200 mg daily and regular weightbearing physical activity is recommended.  4. Atypical lobular hyperplasia of right breast Finished with Tamoxifen.  5. Class 1 obesity due to excess calories without serious comorbidity with body mass index (BMI) of 32.0 to 32.9 in adult Recommend a lower calorie/carb diet such as [employer].  Aerobic activities 5 times a week and light weightlifting every 2 days.  Other orders - omeprazole (PRILOSEC) 40 MG capsule; Take 40 mg by mouth as needed.  Alicia Alexander, it was a pleasure seeing you today!

## 2020-01-15 ENCOUNTER — Other Ambulatory Visit: Payer: Self-pay

## 2020-01-15 DIAGNOSIS — Z20822 Contact with and (suspected) exposure to covid-19: Secondary | ICD-10-CM

## 2020-01-16 LAB — SARS-COV-2, NAA 2 DAY TAT

## 2020-01-16 LAB — NOVEL CORONAVIRUS, NAA: SARS-CoV-2, NAA: NOT DETECTED

## 2020-02-02 ENCOUNTER — Other Ambulatory Visit: Payer: Self-pay | Admitting: Obstetrics & Gynecology

## 2020-02-02 DIAGNOSIS — Z1231 Encounter for screening mammogram for malignant neoplasm of breast: Secondary | ICD-10-CM

## 2020-02-26 ENCOUNTER — Other Ambulatory Visit: Payer: Self-pay

## 2020-02-26 ENCOUNTER — Ambulatory Visit
Admission: RE | Admit: 2020-02-26 | Discharge: 2020-02-26 | Disposition: A | Discharge status: Home / Self Care | Payer: BC Managed Care – PPO | Source: Ambulatory Visit

## 2020-02-26 DIAGNOSIS — Z1231 Encounter for screening mammogram for malignant neoplasm of breast: Secondary | ICD-10-CM

## 2020-04-12 ENCOUNTER — Other Ambulatory Visit: Payer: Self-pay

## 2020-04-12 ENCOUNTER — Ambulatory Visit (INDEPENDENT_AMBULATORY_CARE_PROVIDER_SITE_OTHER): Payer: BC Managed Care – PPO | Admitting: Obstetrics & Gynecology

## 2020-04-12 ENCOUNTER — Encounter: Payer: Self-pay | Admitting: Obstetrics & Gynecology

## 2020-04-12 VITALS — BP 134/78 | Ht 64.5 in | Wt 188.0 lb

## 2020-04-12 DIAGNOSIS — Z6831 Body mass index (BMI) 31.0-31.9, adult: Secondary | ICD-10-CM

## 2020-04-12 DIAGNOSIS — Z90710 Acquired absence of both cervix and uterus: Secondary | ICD-10-CM

## 2020-04-12 DIAGNOSIS — E6609 Other obesity due to excess calories: Secondary | ICD-10-CM

## 2020-04-12 DIAGNOSIS — Z01419 Encounter for gynecological examination (general) (routine) without abnormal findings: Secondary | ICD-10-CM | POA: Diagnosis not present

## 2020-04-12 DIAGNOSIS — Z780 Asymptomatic menopausal state: Secondary | ICD-10-CM | POA: Diagnosis not present

## 2020-04-12 NOTE — Progress Notes (Signed)
HEER JUSTISS [birth year] [id number]   History:    45 y.o. G3P2A1L2Married  [id number] presenting for annual gyn exam   HPI:S/P Total Hysterectomy.No pelvic pain. No pain with IC. Dx of Rt breast atypical lobular hyperplasia.Last mammo negative2/2022. BMI increased to 31.77.  Walking.  Will start a lower calorie/carb diet.Urine/BMs wnl. Health labs with Fam MD.   Past medical history,surgical history, family history and social history were all reviewed and documented in the EPIC chart.  Gynecologic History No LMP recorded. Patient has had a hysterectomy.  Obstetric History OB History  Gravida Para Term Preterm AB Living  3 2 2   1 2   SAB IAB Ectopic Multiple Live Births  1            # Outcome Date GA Lbr Len/2nd Weight Sex Delivery Anes PTL Lv  3 SAB           2 Term           1 Term              ROS: A ROS was performed and pertinent positives and negatives are included in the history.  GENERAL: No fevers or chills. HEENT: No change in vision, no earache, sore throat or sinus congestion. NECK: No pain or stiffness. CARDIOVASCULAR: No chest pain or pressure. No palpitations. PULMONARY: No shortness of breath, cough or wheeze. GASTROINTESTINAL: No abdominal pain, nausea, vomiting or diarrhea, melena or bright red blood per rectum. GENITOURINARY: No urinary frequency, urgency, hesitancy or dysuria. MUSCULOSKELETAL: No joint or muscle pain, no back pain, no recent trauma. DERMATOLOGIC: No rash, no itching, no lesions. ENDOCRINE: No polyuria, polydipsia, no heat or cold intolerance. No recent change in weight. HEMATOLOGICAL: No anemia or easy bruising or bleeding. NEUROLOGIC: No headache, seizures, numbness, tingling or weakness. PSYCHIATRIC: No depression, no loss of interest in normal activity or change in sleep pattern.     Exam:   BP 134/78   Ht 5' 4.5" (1.638 m)   Wt 188 lb (85.3 kg)   BMI 31.77 kg/m   Body mass index is 31.77  kg/m.  General appearance : Well developed well nourished female. No acute distress HEENT: Eyes: no retinal hemorrhage or exudates,  Neck supple, trachea midline, no carotid bruits, no thyroidmegaly Lungs: Clear to auscultation, no rhonchi or wheezes, or rib retractions  Heart: Regular rate and rhythm, no murmurs or gallops Breast:Examined in sitting and supine position were symmetrical in appearance, no palpable masses or tenderness,  no skin retraction, no nipple inversion, no nipple discharge, no skin discoloration, no axillary or supraclavicular lymphadenopathy Abdomen: no palpable masses or tenderness, no rebound or guarding Extremities: no edema or skin discoloration or tenderness  Pelvic: Vulva: Normal             Vagina: No gross lesions or discharge  Cervix/Uterus absent  Adnexa  Without masses or tenderness  Anus: Normal   Assessment/Plan:  45 y.o. female for annual exam   1. Well female exam with routine gynecological exam Gynecologic exam status post total hysterectomy and menopause.  No indication for Pap test this year, Pap test negative in 2020.  Breast exam normal.  History of right breast atypical lobular hyperplasia followed by Dr. Jana Hakim.  Stopped tamoxifen after 5 years.  Health labs with family physician.  2. S/P total hysterectomy  3. Postmenopause Well on no hormone replacement therapy.  Recommend vitamin D supplements, calcium intake of 1.5 g/day total and regular weightbearing physical activities.  4.  Class 1 obesity due to excess calories without serious comorbidity with body mass index (BMI) of 31.0 to 31.9 in adult Recommend a lower calorie/carb diet and aerobic activities 5 times a week with light weightlifting every 2 days.  Other orders - fluticasone (FLONASE) 50 MCG/ACT nasal spray; Place into both nostrils daily.  Princess Bruins MD, 8:47 AM 04/12/2020

## 2021-01-24 ENCOUNTER — Other Ambulatory Visit: Payer: Self-pay | Admitting: Obstetrics & Gynecology

## 2021-01-24 DIAGNOSIS — Z1231 Encounter for screening mammogram for malignant neoplasm of breast: Secondary | ICD-10-CM

## 2021-02-26 ENCOUNTER — Ambulatory Visit
Admission: RE | Admit: 2021-02-26 | Discharge: 2021-02-26 | Disposition: A | Discharge status: Home / Self Care | Payer: BC Managed Care – PPO | Source: Ambulatory Visit

## 2021-02-26 DIAGNOSIS — Z1231 Encounter for screening mammogram for malignant neoplasm of breast: Secondary | ICD-10-CM

## 2021-04-18 ENCOUNTER — Ambulatory Visit: Payer: BC Managed Care – PPO | Admitting: Obstetrics & Gynecology

## 2021-05-01 ENCOUNTER — Ambulatory Visit: Payer: BC Managed Care – PPO | Admitting: Obstetrics & Gynecology

## 2022-01-29 ENCOUNTER — Other Ambulatory Visit: Payer: Self-pay | Admitting: Family Medicine

## 2022-01-29 DIAGNOSIS — Z1231 Encounter for screening mammogram for malignant neoplasm of breast: Secondary | ICD-10-CM

## 2022-03-12 ENCOUNTER — Ambulatory Visit
Admission: RE | Admit: 2022-03-12 | Discharge: 2022-03-12 | Disposition: A | Discharge status: Home / Self Care | Payer: No Typology Code available for payment source | Source: Ambulatory Visit

## 2022-03-12 DIAGNOSIS — Z1231 Encounter for screening mammogram for malignant neoplasm of breast: Secondary | ICD-10-CM

## 2023-04-22 ENCOUNTER — Other Ambulatory Visit: Payer: Self-pay | Admitting: Family Medicine

## 2023-04-22 DIAGNOSIS — Z1231 Encounter for screening mammogram for malignant neoplasm of breast: Secondary | ICD-10-CM

## 2023-04-27 IMAGING — MG MM DIGITAL SCREENING BILAT W/ TOMO AND CAD
8 series · 8 of 24 positions shown · non-contrast
Comparison: Previous exam(s).

CLINICAL DATA: Screening.

EXAM:
DIGITAL SCREENING BILATERAL MAMMOGRAM WITH TOMOSYNTHESIS AND CAD
TECHNIQUE: Bilateral screening digital craniocaudal and mediolateral oblique
mammograms were obtained. Bilateral screening digital breast
tomosynthesis was performed. The images were evaluated with
computer-aided detection.

[L MLO synth-2D]
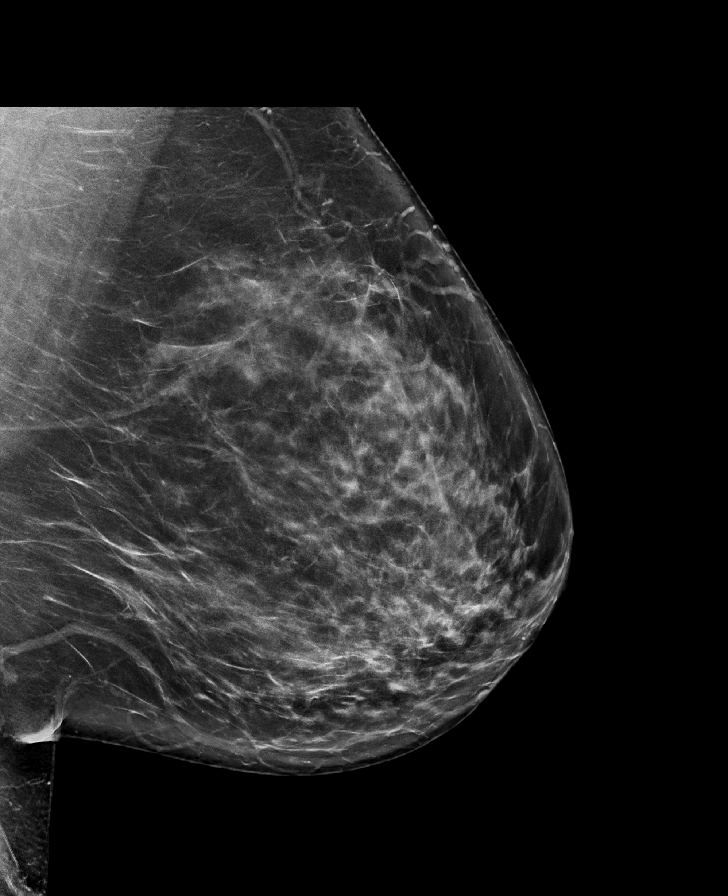

[R CC synth-2D]
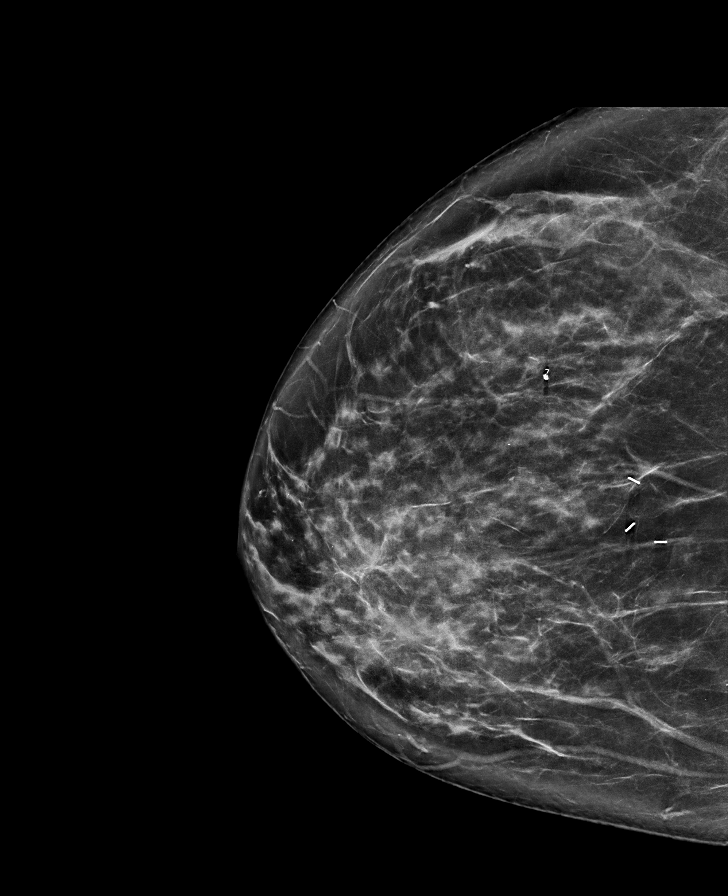

[L CC synth-2D]
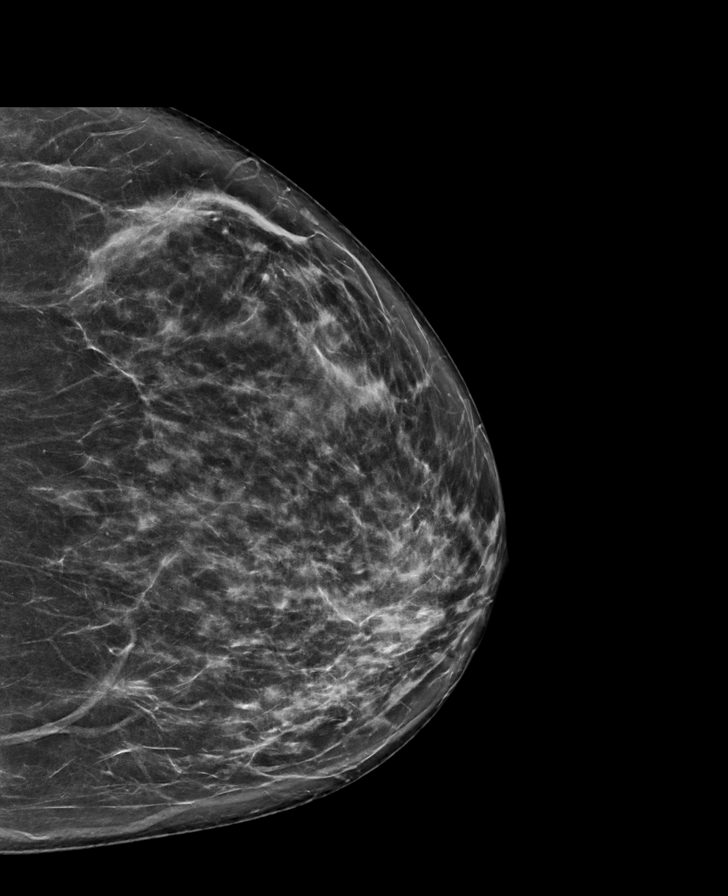

[R MLO synth-2D]
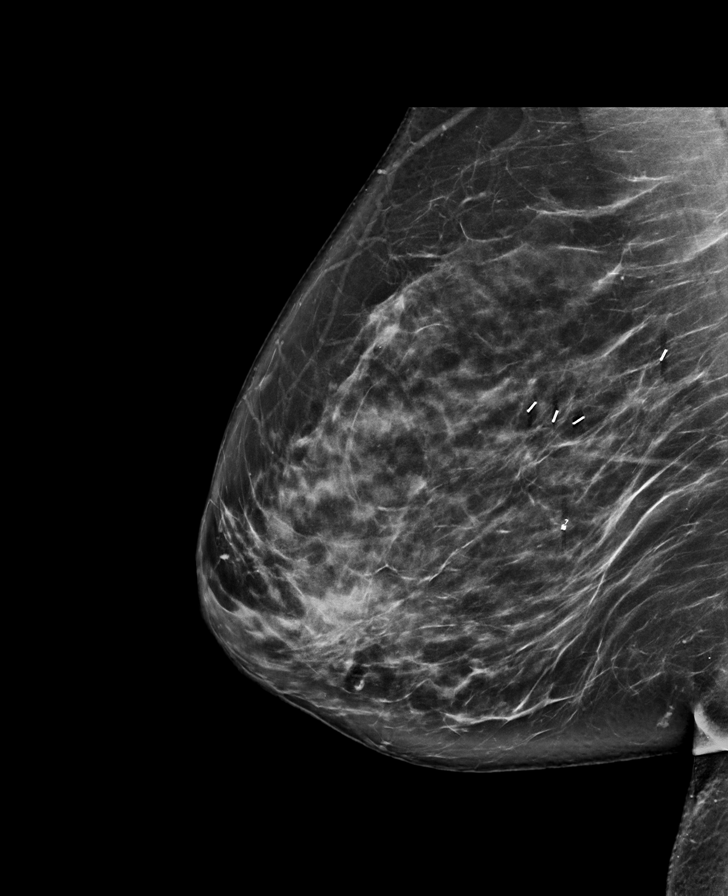

[L MLO tomo · tomo slice 51/100.0]
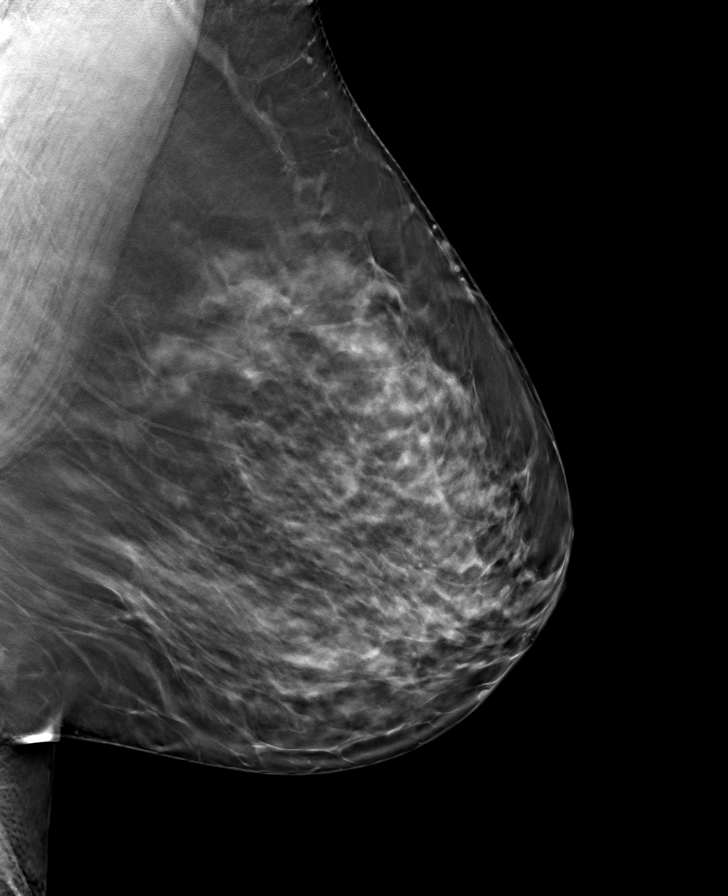

[L CC tomo · tomo slice 43/86.0]
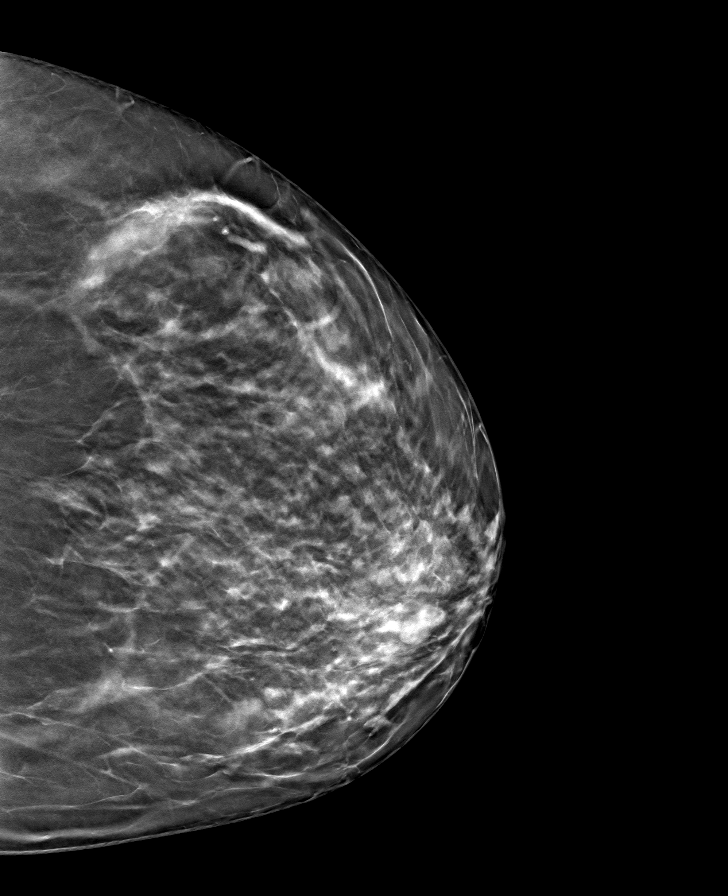

[R MLO tomo · tomo slice 46/91.0]
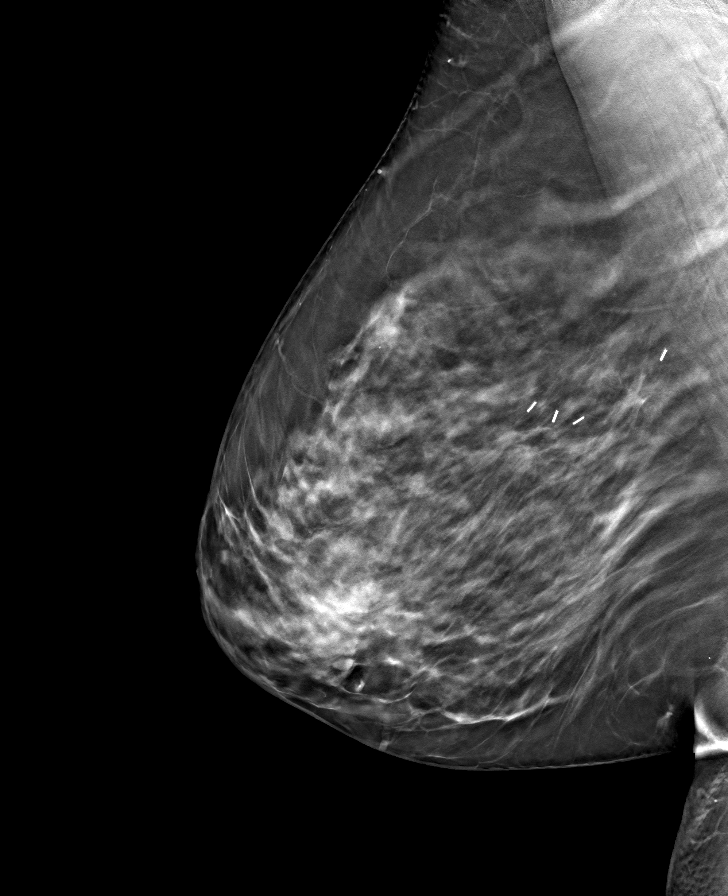

[R CC tomo · tomo slice 43/85.0]
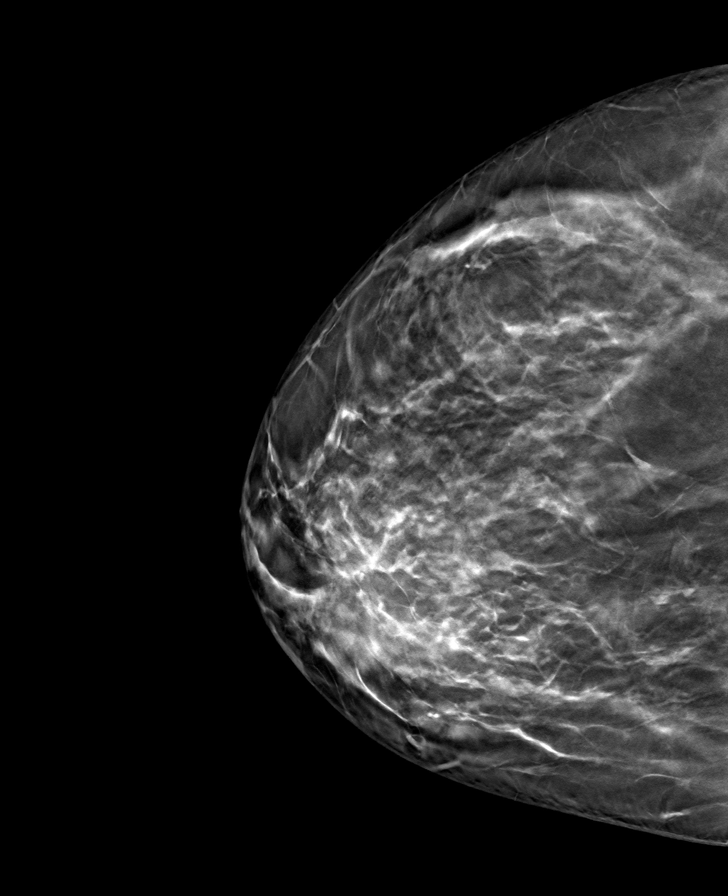

[8 of 24 positions shown; findings below may reference images not displayed]

ACR Breast Density Category c: The breast tissue is heterogeneously
dense, which may obscure small masses.
FINDINGS: There are no findings suspicious for malignancy.
IMPRESSION: No mammographic evidence of malignancy. A result letter of this
screening mammogram will be mailed directly to the patient.

RECOMMENDATION:
Screening mammogram in one year. (Code:Q3-W-BC3)

BI-RADS CATEGORY  1: Negative.

## 2023-04-30 ENCOUNTER — Ambulatory Visit
Admission: RE | Admit: 2023-04-30 | Discharge: 2023-04-30 | Disposition: A | Discharge status: Home / Self Care | Source: Ambulatory Visit

## 2023-04-30 DIAGNOSIS — Z1231 Encounter for screening mammogram for malignant neoplasm of breast: Secondary | ICD-10-CM

## 2023-05-06 ENCOUNTER — Other Ambulatory Visit: Payer: Self-pay | Admitting: Family Medicine

## 2023-05-06 DIAGNOSIS — R928 Other abnormal and inconclusive findings on diagnostic imaging of breast: Secondary | ICD-10-CM

## 2023-05-20 ENCOUNTER — Encounter (HOSPITAL_COMMUNITY): Payer: Self-pay

## 2023-05-27 ENCOUNTER — Ambulatory Visit
Admission: RE | Admit: 2023-05-27 | Discharge: 2023-05-27 | Disposition: A | Discharge status: Home / Self Care | Source: Ambulatory Visit | Attending: Family Medicine

## 2023-05-27 ENCOUNTER — Other Ambulatory Visit

## 2023-05-27 ENCOUNTER — Ambulatory Visit: Admission: RE | Admit: 2023-05-27 | Source: Ambulatory Visit

## 2023-05-27 DIAGNOSIS — R928 Other abnormal and inconclusive findings on diagnostic imaging of breast: Secondary | ICD-10-CM

## 2023-05-28 ENCOUNTER — Other Ambulatory Visit: Payer: Self-pay | Admitting: Family Medicine

## 2023-05-28 DIAGNOSIS — R921 Mammographic calcification found on diagnostic imaging of breast: Secondary | ICD-10-CM

## 2023-06-10 ENCOUNTER — Ambulatory Visit
Admission: RE | Admit: 2023-06-10 | Discharge: 2023-06-10 | Disposition: A | Discharge status: Home / Self Care | Source: Ambulatory Visit | Attending: Family Medicine

## 2023-06-10 ENCOUNTER — Ambulatory Visit
Admission: RE | Admit: 2023-06-10 | Discharge: 2023-06-10 | Disposition: A | Discharge status: Home / Self Care | Source: Ambulatory Visit | Attending: Family Medicine | Admitting: Family Medicine

## 2023-06-10 DIAGNOSIS — R921 Mammographic calcification found on diagnostic imaging of breast: Secondary | ICD-10-CM

## 2023-06-11 LAB — SURGICAL PATHOLOGY
